# Patient Record
Sex: Female | Born: 1995 | Race: Black or African American | Hispanic: No | Smoking: Never smoker
Health system: Southern US, Community
[De-identification: ages and names within clinical notes are randomized; demographics above are authoritative.]

## PROBLEM LIST (undated history)

## (undated) DIAGNOSIS — O1420 HELLP syndrome (HELLP), unspecified trimester: Secondary | ICD-10-CM

## (undated) DIAGNOSIS — J45909 Unspecified asthma, uncomplicated: Secondary | ICD-10-CM

## (undated) DIAGNOSIS — D649 Anemia, unspecified: Secondary | ICD-10-CM

## (undated) HISTORY — PX: PILONIDAL CYST EXCISION: SHX744

---

## 2017-10-21 DIAGNOSIS — O09812 Supervision of pregnancy resulting from assisted reproductive technology, second trimester: Secondary | ICD-10-CM | POA: Diagnosis not present

## 2017-10-21 DIAGNOSIS — O30042 Twin pregnancy, dichorionic/diamniotic, second trimester: Secondary | ICD-10-CM | POA: Diagnosis not present

## 2017-10-21 DIAGNOSIS — Z3A15 15 weeks gestation of pregnancy: Secondary | ICD-10-CM | POA: Diagnosis not present

## 2017-10-23 DIAGNOSIS — R946 Abnormal results of thyroid function studies: Secondary | ICD-10-CM | POA: Diagnosis not present

## 2017-10-23 DIAGNOSIS — E049 Nontoxic goiter, unspecified: Secondary | ICD-10-CM | POA: Diagnosis not present

## 2017-10-23 DIAGNOSIS — E041 Nontoxic single thyroid nodule: Secondary | ICD-10-CM | POA: Diagnosis not present

## 2017-10-28 DIAGNOSIS — O26892 Other specified pregnancy related conditions, second trimester: Secondary | ICD-10-CM | POA: Diagnosis not present

## 2017-10-28 DIAGNOSIS — R51 Headache: Secondary | ICD-10-CM | POA: Diagnosis not present

## 2017-10-28 DIAGNOSIS — R110 Nausea: Secondary | ICD-10-CM | POA: Diagnosis not present

## 2017-11-19 DIAGNOSIS — Z3A19 19 weeks gestation of pregnancy: Secondary | ICD-10-CM | POA: Diagnosis not present

## 2017-11-19 DIAGNOSIS — O30042 Twin pregnancy, dichorionic/diamniotic, second trimester: Secondary | ICD-10-CM | POA: Diagnosis not present

## 2017-12-22 DIAGNOSIS — O30042 Twin pregnancy, dichorionic/diamniotic, second trimester: Secondary | ICD-10-CM | POA: Diagnosis not present

## 2017-12-22 DIAGNOSIS — O09812 Supervision of pregnancy resulting from assisted reproductive technology, second trimester: Secondary | ICD-10-CM | POA: Diagnosis not present

## 2017-12-22 DIAGNOSIS — Z3A24 24 weeks gestation of pregnancy: Secondary | ICD-10-CM | POA: Diagnosis not present

## 2018-01-20 DIAGNOSIS — O26873 Cervical shortening, third trimester: Secondary | ICD-10-CM | POA: Diagnosis not present

## 2018-01-20 DIAGNOSIS — O09812 Supervision of pregnancy resulting from assisted reproductive technology, second trimester: Secondary | ICD-10-CM | POA: Diagnosis not present

## 2018-01-20 DIAGNOSIS — O30042 Twin pregnancy, dichorionic/diamniotic, second trimester: Secondary | ICD-10-CM | POA: Diagnosis not present

## 2018-01-20 DIAGNOSIS — Z3A28 28 weeks gestation of pregnancy: Secondary | ICD-10-CM | POA: Diagnosis not present

## 2018-01-20 DIAGNOSIS — O30043 Twin pregnancy, dichorionic/diamniotic, third trimester: Secondary | ICD-10-CM | POA: Diagnosis not present

## 2018-02-03 DIAGNOSIS — O30043 Twin pregnancy, dichorionic/diamniotic, third trimester: Secondary | ICD-10-CM | POA: Diagnosis not present

## 2018-02-03 DIAGNOSIS — O26873 Cervical shortening, third trimester: Secondary | ICD-10-CM | POA: Diagnosis not present

## 2018-02-03 DIAGNOSIS — Z3A30 30 weeks gestation of pregnancy: Secondary | ICD-10-CM | POA: Diagnosis not present

## 2018-02-07 DIAGNOSIS — O30043 Twin pregnancy, dichorionic/diamniotic, third trimester: Secondary | ICD-10-CM | POA: Diagnosis not present

## 2018-02-07 DIAGNOSIS — Z3A30 30 weeks gestation of pregnancy: Secondary | ICD-10-CM | POA: Diagnosis not present

## 2018-02-07 DIAGNOSIS — O99713 Diseases of the skin and subcutaneous tissue complicating pregnancy, third trimester: Secondary | ICD-10-CM | POA: Diagnosis not present

## 2018-02-07 DIAGNOSIS — L309 Dermatitis, unspecified: Secondary | ICD-10-CM | POA: Diagnosis not present

## 2018-02-07 DIAGNOSIS — O4703 False labor before 37 completed weeks of gestation, third trimester: Secondary | ICD-10-CM | POA: Diagnosis not present

## 2018-02-07 DIAGNOSIS — O09813 Supervision of pregnancy resulting from assisted reproductive technology, third trimester: Secondary | ICD-10-CM | POA: Diagnosis not present

## 2018-02-08 DIAGNOSIS — Z3A30 30 weeks gestation of pregnancy: Secondary | ICD-10-CM | POA: Diagnosis not present

## 2018-02-08 DIAGNOSIS — O30043 Twin pregnancy, dichorionic/diamniotic, third trimester: Secondary | ICD-10-CM | POA: Diagnosis not present

## 2018-02-08 DIAGNOSIS — O09813 Supervision of pregnancy resulting from assisted reproductive technology, third trimester: Secondary | ICD-10-CM | POA: Diagnosis not present

## 2018-02-08 DIAGNOSIS — L309 Dermatitis, unspecified: Secondary | ICD-10-CM | POA: Diagnosis not present

## 2018-02-08 DIAGNOSIS — O99213 Obesity complicating pregnancy, third trimester: Secondary | ICD-10-CM | POA: Diagnosis not present

## 2018-02-08 DIAGNOSIS — O99713 Diseases of the skin and subcutaneous tissue complicating pregnancy, third trimester: Secondary | ICD-10-CM | POA: Diagnosis not present

## 2018-02-08 DIAGNOSIS — E669 Obesity, unspecified: Secondary | ICD-10-CM | POA: Diagnosis not present

## 2018-02-09 DIAGNOSIS — Z3A31 31 weeks gestation of pregnancy: Secondary | ICD-10-CM | POA: Diagnosis not present

## 2018-02-09 DIAGNOSIS — O30043 Twin pregnancy, dichorionic/diamniotic, third trimester: Secondary | ICD-10-CM | POA: Diagnosis not present

## 2018-02-11 DIAGNOSIS — O30043 Twin pregnancy, dichorionic/diamniotic, third trimester: Secondary | ICD-10-CM | POA: Diagnosis not present

## 2018-02-11 DIAGNOSIS — Z3A31 31 weeks gestation of pregnancy: Secondary | ICD-10-CM | POA: Diagnosis not present

## 2018-02-11 DIAGNOSIS — O09813 Supervision of pregnancy resulting from assisted reproductive technology, third trimester: Secondary | ICD-10-CM | POA: Diagnosis not present

## 2018-02-12 DIAGNOSIS — O30013 Twin pregnancy, monochorionic/monoamniotic, third trimester: Secondary | ICD-10-CM | POA: Diagnosis not present

## 2018-02-12 DIAGNOSIS — O4703 False labor before 37 completed weeks of gestation, third trimester: Secondary | ICD-10-CM | POA: Diagnosis not present

## 2018-02-12 DIAGNOSIS — Z3A31 31 weeks gestation of pregnancy: Secondary | ICD-10-CM | POA: Diagnosis not present

## 2018-02-13 DIAGNOSIS — O26893 Other specified pregnancy related conditions, third trimester: Secondary | ICD-10-CM | POA: Diagnosis not present

## 2018-02-13 DIAGNOSIS — R1084 Generalized abdominal pain: Secondary | ICD-10-CM | POA: Diagnosis not present

## 2018-02-13 DIAGNOSIS — O30013 Twin pregnancy, monochorionic/monoamniotic, third trimester: Secondary | ICD-10-CM | POA: Diagnosis not present

## 2018-02-13 DIAGNOSIS — Z3A31 31 weeks gestation of pregnancy: Secondary | ICD-10-CM | POA: Diagnosis not present

## 2018-02-28 DIAGNOSIS — O1423 HELLP syndrome (HELLP), third trimester: Secondary | ICD-10-CM

## 2018-06-15 ENCOUNTER — Ambulatory Visit
Admission: EM | Admit: 2018-06-15 | Discharge: 2018-06-15 | Disposition: A | Discharge status: Home / Self Care | Payer: BLUE CROSS/BLUE SHIELD | Attending: Family Medicine | Admitting: Family Medicine

## 2018-06-15 ENCOUNTER — Encounter: Payer: Self-pay | Admitting: Emergency Medicine

## 2018-06-15 ENCOUNTER — Other Ambulatory Visit: Payer: Self-pay

## 2018-06-15 DIAGNOSIS — A084 Viral intestinal infection, unspecified: Secondary | ICD-10-CM

## 2018-06-15 MED ORDER — ONDANSETRON 8 MG PO TBDP: 8.0000 mg | ORAL_TABLET | Freq: Three times a day (TID) | ORAL | 0 refills | Status: DC | PRN

## 2018-06-15 NOTE — ED Triage Notes (Signed)
Patient c/o nausea, body aches and headache that started last night. Denies fever.

## 2018-06-15 NOTE — ED Provider Notes (Signed)
[facility]    CSN: [id number] Arrival date & time: 06/15/18  1523     History   Chief Complaint Chief Complaint  Patient presents with  . Nausea  . Headache    HPI Jhenesis Keefauver is a 23 y.o. female.   23 yo female with a c/o nausea, bodyaches, stomache and headache since last night. Denies any fevers, vomiting or diarrhea.   The history is provided by the patient.  Headache    History reviewed. No pertinent past medical history.  There are no active problems to display for this patient.   Past Surgical History:  Procedure Laterality Date  . CESAREAN SECTION      OB History   No obstetric history on file.      Home Medications    Prior to Admission medications   Medication Sig Start Date End Date Taking? Authorizing Provider  ondansetron (ZOFRAN ODT) 8 MG disintegrating tablet Take 1 tablet (8 mg total) by mouth every 8 (eight) hours as needed. 06/15/18   Payton Mccallum, MD    Family History History reviewed. No pertinent family history.  Social History Social History   Tobacco Use  . Smoking status: Never Smoker  . Smokeless tobacco: Never Used  Substance Use Topics  . Alcohol use: Not Currently  . Drug use: Never     Allergies   Patient has no known allergies.   Review of Systems Review of Systems  Neurological: Positive for headaches.     Physical Exam Triage Vital Signs ED Triage Vitals  Enc Vitals Group     BP 06/15/18 1555 (!) 128/93     Pulse Rate 06/15/18 1555 73     Resp 06/15/18 1555 18     Temp 06/15/18 1555 98.5 F (36.9 C)     Temp Source 06/15/18 1555 Oral     SpO2 06/15/18 1555 100 %     Weight 06/15/18 1553 150 lb (68 kg)     Height 06/15/18 1553 5\' 1"  (1.549 m)     Head Circumference --      Peak Flow --      Pain Score 06/15/18 1553 6     Pain Loc --      Pain Edu? --      Excl. in [place]? --    No data found.  Updated Vital Signs BP (!) 128/93 (BP Location: Right Arm)   Pulse 73   Temp 98.5 F  (36.9 C) (Oral)   Resp 18   Ht 5\' 1"  (1.549 m)   Wt 68 kg   LMP 06/08/2018   SpO2 100%   BMI 28.34 kg/m   Visual Acuity Right Eye Distance:   Left Eye Distance:   Bilateral Distance:    Right Eye Near:   Left Eye Near:    Bilateral Near:     Physical Exam Constitutional:      General: She is not in acute distress.    Appearance: She is not toxic-appearing or diaphoretic.  Cardiovascular:     Rate and Rhythm: Normal rate.  Pulmonary:     Effort: Pulmonary effort is normal. No respiratory distress.  Abdominal:     General: Bowel sounds are normal. There is no distension.     Palpations: Abdomen is soft. There is no mass.     Tenderness: There is no abdominal tenderness. There is no right CVA tenderness, left CVA tenderness, guarding or rebound.     Hernia: No hernia is present.  Neurological:  Mental Status: She is alert.      UC Treatments / Results  Labs (all labs ordered are listed, but only abnormal results are displayed) Labs Reviewed - No data to display  EKG None  Radiology No results found.  Procedures Procedures (including critical care time)  Medications Ordered in UC Medications - No data to display  Initial Impression / Assessment and Plan / UC Course  I have reviewed the triage vital signs and the nursing notes.  Pertinent labs & imaging results that were available during my care of the patient were reviewed by me and considered in my medical decision making (see chart for details).      Final Clinical Impressions(s) / UC Diagnoses   Final diagnoses:  Viral gastroenteritis    ED Prescriptions    Medication Sig Dispense Auth. Provider   ondansetron (ZOFRAN ODT) 8 MG disintegrating tablet Take 1 tablet (8 mg total) by mouth every 8 (eight) hours as needed. 6 tablet Payton Mccallum, MD     1. diagnosis reviewed with patient 2. rx as per orders above; reviewed possible side effects, interactions, risks and benefits  3. Recommend  supportive treatment with rest, clear liquids then advance diet slowly as tolerated 4. Follow-up prn if symptoms worsen or don't improve   Controlled Substance Prescriptions [place] Controlled Substance Registry consulted? Not Applicable   Payton Mccallum, MD 06/15/18 [phone]

## 2019-06-03 DIAGNOSIS — Z3401 Encounter for supervision of normal first pregnancy, first trimester: Secondary | ICD-10-CM | POA: Diagnosis not present

## 2019-06-03 DIAGNOSIS — Z3480 Encounter for supervision of other normal pregnancy, unspecified trimester: Secondary | ICD-10-CM | POA: Diagnosis not present

## 2019-06-03 DIAGNOSIS — Z8759 Personal history of other complications of pregnancy, childbirth and the puerperium: Secondary | ICD-10-CM | POA: Diagnosis not present

## 2019-06-03 DIAGNOSIS — Z3481 Encounter for supervision of other normal pregnancy, first trimester: Secondary | ICD-10-CM | POA: Diagnosis not present

## 2019-06-19 ENCOUNTER — Other Ambulatory Visit: Payer: Self-pay

## 2019-06-19 ENCOUNTER — Encounter: Payer: Self-pay | Admitting: Emergency Medicine

## 2019-06-19 DIAGNOSIS — Z3A01 Less than 8 weeks gestation of pregnancy: Secondary | ICD-10-CM | POA: Diagnosis not present

## 2019-06-19 DIAGNOSIS — O468X1 Other antepartum hemorrhage, first trimester: Secondary | ICD-10-CM | POA: Diagnosis not present

## 2019-06-19 DIAGNOSIS — Z3A11 11 weeks gestation of pregnancy: Secondary | ICD-10-CM | POA: Diagnosis not present

## 2019-06-19 DIAGNOSIS — O368910 Maternal care for other specified fetal problems, first trimester, not applicable or unspecified: Secondary | ICD-10-CM | POA: Diagnosis not present

## 2019-06-19 DIAGNOSIS — O418X11 Other specified disorders of amniotic fluid and membranes, first trimester, fetus 1: Secondary | ICD-10-CM | POA: Diagnosis not present

## 2019-06-19 DIAGNOSIS — O209 Hemorrhage in early pregnancy, unspecified: Secondary | ICD-10-CM | POA: Diagnosis present

## 2019-06-19 DIAGNOSIS — O208 Other hemorrhage in early pregnancy: Secondary | ICD-10-CM | POA: Diagnosis not present

## 2019-06-19 LAB — CBC WITH DIFFERENTIAL/PLATELET
Abs Immature Granulocytes: 0.02 10*3/uL (ref 0.00–0.07)
Basophils Absolute: 0 10*3/uL (ref 0.0–0.1)
Basophils Relative: 1 %
Eosinophils Absolute: 0.1 10*3/uL (ref 0.0–0.5)
Eosinophils Relative: 1 %
HCT: 36.3 % (ref 36.0–46.0)
Hemoglobin: 11.5 g/dL — ABNORMAL LOW (ref 12.0–15.0)
Immature Granulocytes: 0 %
Lymphocytes Relative: 27 %
Lymphs Abs: 2.3 10*3/uL (ref 0.7–4.0)
MCH: 26.3 pg (ref 26.0–34.0)
MCHC: 31.7 g/dL (ref 30.0–36.0)
MCV: 82.9 fL (ref 80.0–100.0)
Monocytes Absolute: 0.5 10*3/uL (ref 0.1–1.0)
Monocytes Relative: 6 %
Neutro Abs: 5.7 10*3/uL (ref 1.7–7.7)
Neutrophils Relative %: 65 %
Platelets: 413 10*3/uL — ABNORMAL HIGH (ref 150–400)
RBC: 4.38 MIL/uL (ref 3.87–5.11)
RDW: 16.1 % — ABNORMAL HIGH (ref 11.5–15.5)
WBC: 8.7 10*3/uL (ref 4.0–10.5)
nRBC: 0 % (ref 0.0–0.2)

## 2019-06-19 LAB — URINALYSIS, COMPLETE (UACMP) WITH MICROSCOPIC
Bilirubin Urine: NEGATIVE
Glucose, UA: NEGATIVE mg/dL
Ketones, ur: 20 mg/dL — AB
Nitrite: NEGATIVE
Protein, ur: 100 mg/dL — AB
Specific Gravity, Urine: 1.026 (ref 1.005–1.030)
pH: 6 (ref 5.0–8.0)

## 2019-06-19 NOTE — ED Triage Notes (Signed)
Patient states that she is [redacted] weeks pregnant and about an hour ago she started having some vaginal bleeding. Patient denies abdominal pain.

## 2019-06-20 ENCOUNTER — Emergency Department
Admission: EM | Admit: 2019-06-20 | Discharge: 2019-06-20 | Disposition: A | Discharge status: Home / Self Care | Payer: BC Managed Care – PPO | Attending: Emergency Medicine | Admitting: Emergency Medicine

## 2019-06-20 ENCOUNTER — Emergency Department: Payer: BC Managed Care – PPO

## 2019-06-20 DIAGNOSIS — Z3A11 11 weeks gestation of pregnancy: Secondary | ICD-10-CM | POA: Diagnosis not present

## 2019-06-20 DIAGNOSIS — O4690 Antepartum hemorrhage, unspecified, unspecified trimester: Secondary | ICD-10-CM

## 2019-06-20 DIAGNOSIS — O418X11 Other specified disorders of amniotic fluid and membranes, first trimester, fetus 1: Secondary | ICD-10-CM | POA: Diagnosis not present

## 2019-06-20 DIAGNOSIS — O208 Other hemorrhage in early pregnancy: Secondary | ICD-10-CM | POA: Diagnosis not present

## 2019-06-20 DIAGNOSIS — O368910 Maternal care for other specified fetal problems, first trimester, not applicable or unspecified: Secondary | ICD-10-CM | POA: Diagnosis not present

## 2019-06-20 DIAGNOSIS — O418X10 Other specified disorders of amniotic fluid and membranes, first trimester, not applicable or unspecified: Secondary | ICD-10-CM

## 2019-06-20 DIAGNOSIS — Z3A01 Less than 8 weeks gestation of pregnancy: Secondary | ICD-10-CM | POA: Diagnosis not present

## 2019-06-20 LAB — ABO/RH: ABO/RH(D): A POS

## 2019-06-20 LAB — HCG, QUANTITATIVE, PREGNANCY: hCG, Beta Chain, Quant, S: 78224 m[IU]/mL — ABNORMAL HIGH (ref <5)

## 2019-06-20 MED ORDER — DEXTROSE-NACL 5-0.9 % IV SOLN: INTRAVENOUS | Status: DC

## 2019-06-20 MED ORDER — SODIUM CHLORIDE 0.9 % IV BOLUS
1000.0000 mL | Freq: Once | INTRAVENOUS | Status: AC
  Administered 2019-06-20: 1000 mL via INTRAVENOUS

## 2019-06-20 NOTE — ED Notes (Signed)
Pt assisted to the restroom at this time. Pt st she feels better, pt informed of pending d/c

## 2019-06-20 NOTE — ED Provider Notes (Signed)
[facility] Emergency Department Provider Note  ____________________________________________   First MD Initiated Contact with Patient 06/20/19 0038     (approximate)  I have reviewed the triage vital signs and the nursing notes.   HISTORY  Chief Complaint Vaginal Bleeding    HPI Sherry Lynn is a 24 y.o. female G2 P1-0-0-2 (previous twin gestation) currently [redacted] weeks pregnant presents to the emergency department secondary to acute onset of painless vaginal bleeding which began 1 hour before arrival.  Patient states that she noted a "gush of blood" at onset.  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 No pertinent past medical history.  There are no problems to display for this patient.   Past Surgical History:  Procedure Laterality Date  . CESAREAN SECTION    . PILONIDAL CYST EXCISION      Prior to Admission medications   Medication Sig Start Date End Date Taking? Authorizing Provider  ondansetron (ZOFRAN ODT) 8 MG disintegrating tablet Take 1 tablet (8 mg total) by mouth every 8 (eight) hours as needed. 06/15/18   Norval Gable, MD    Allergies Other  No family history on file.  Social History Social History   Tobacco Use  . Smoking status: Never Smoker  . Smokeless tobacco: Never Used  Substance Use Topics  . Alcohol use: Not Currently  . Drug use: Never    Review of Systems Constitutional: No fever/chills Eyes: No visual changes. ENT: No sore throat. Cardiovascular: Denies chest pain. Respiratory: Denies shortness of breath. Gastrointestinal: No abdominal pain.  No nausea, no vomiting.  No diarrhea.  No constipation. Genitourinary: Positive for vaginal bleeding Musculoskeletal: Negative for neck pain.  Negative for back pain. Integumentary: Negative for rash. Neurological: Negative for  headaches, focal weakness or numbness.  ____________________________________________   PHYSICAL EXAM:  VITAL SIGNS: ED Triage Vitals  Enc Vitals Group     BP 06/19/19 2300 134/80     Pulse Rate 06/19/19 2300 84     Resp 06/19/19 2300 18     Temp 06/19/19 2300 98.4 F (36.9 C)     Temp Source 06/19/19 2300 Oral     SpO2 06/19/19 2300 100 %     Weight 06/19/19 2301 83 kg (183 lb)     Height 06/19/19 2301 1.549 m (5\' 1" )     Head Circumference --      Peak Flow --      Pain Score 06/19/19 2301 0     Pain Loc --      Pain Edu? --      Excl. in [place]? --     Constitutional: Alert and oriented.  Eyes: Conjunctivae are normal.  Mouth/Throat: Patient is wearing a mask. Neck: No stridor.  No meningeal signs.   Cardiovascular: Normal rate, regular rhythm. Good peripheral circulation. Grossly normal heart sounds. Respiratory: Normal respiratory effort.  No retractions. Gastrointestinal: Soft and nontender. No distention.  Genitourinary: Scant blood noted in the vagina cervical os closed Musculoskeletal: No lower extremity tenderness nor edema. No gross deformities of extremities. Neurologic:  Normal speech and language. No gross focal neurologic deficits are appreciated.  Skin:  Skin is warm, dry and intact. Psychiatric: Mood and affect are normal. Speech and behavior are normal.  ____________________________________________   LABS (all labs ordered are listed, but only abnormal results are displayed)  Labs Reviewed  CBC WITH  DIFFERENTIAL/PLATELET - Abnormal; Notable for the following components:      Result Value   Hemoglobin 11.5 (*)    RDW 16.1 (*)    Platelets 413 (*)    All other components within normal limits  HCG, QUANTITATIVE, PREGNANCY - Abnormal; Notable for the following components:   hCG, Beta Chain, Quant, S 78,224 (*)    All other components within normal limits  URINALYSIS, COMPLETE (UACMP) WITH MICROSCOPIC - Abnormal; Notable for the following components:    Color, Urine PINK (*)    APPearance CLOUDY (*)    Hgb urine dipstick LARGE (*)    Ketones, ur 20 (*)    Protein, ur 100 (*)    Leukocytes,Ua MODERATE (*)    All other components within normal limits  BASIC METABOLIC PANEL  POC URINE PREG, ED  ABO/RH     RADIOLOGY I, Langford N Dailon Sheeran, personally viewed and evaluated these images (plain radiographs) as part of my medical decision making, as well as reviewing the written report by the radiologist.  ED MD interpretation: 11 weeks 4 days intrauterine pregnancy with fetal heart rate 153.  Small subchorionic hemorrhage noted per radiologist on OB ultrasound less than 14 weeks  Official radiology report(s): US OB Comp Less 14 Wks  Result Date: 06/20/2019 CLINICAL DATA:  Vaginal bleeding EXAM: OBSTETRIC <14 WK ULTRASOUND TECHNIQUE: Transabdominal ultrasound was performed for evaluation of the gestation as well as the maternal uterus and adnexal regions. COMPARISON:  None. FINDINGS: Intrauterine gestational sac: Single Yolk sac:  Not visualized Embryo:  Visualized Cardiac Activity: Visualized Heart Rate: 153 bpm MSD:    mm    w     d CRL:   47.4 mm   11 w 4 d                  [place] EDC: 01/05/2020 Subchorionic hemorrhage:  Small subchorionic hemorrhage Maternal uterus/adnexae: Pedunculated 2.1 cm fundal fibroid. No adnexal mass or free fluid. IMPRESSION: Eleven week 4 day intrauterine pregnancy. Fetal heart rate 153 beats per minute. Small subchorionic hemorrhage. Electronically Signed   By: Charlett Nose M.D.   On: 06/20/2019 01:25      Procedures   ____________________________________________   INITIAL IMPRESSION / MDM / ASSESSMENT AND PLAN / ED COURSE  As part of my medical decision making, I reviewed the following data within the electronic MEDICAL RECORD NUMBER   24 year old female presented with above-stated history and physical exam secondary to vaginal bleeding within the first trimester pregnancy.  Vaginal exam revealed scant blood noted in  vagina with a closed cervical os.  Ultrasound revealed a small subchorionic hematoma.  Patient's blood type a positive.  Spoke with the patient at length regarding the necessity of following up with OB/GYN and/or returning to the emergency department immediately if pain or worsening bleeding were to occur. ____________________________________________  FINAL CLINICAL IMPRESSION(S) / ED DIAGNOSES  Final diagnoses:  Vaginal bleeding in pregnancy  Subchorionic hematoma in first trimester, single or unspecified fetus     MEDICATIONS GIVEN DURING THIS VISIT:  Medications  dextrose 5 %-0.9 % sodium chloride infusion ( Intravenous New Bag/Given 06/20/19 0157)  sodium chloride 0.9 % bolus 1,000 mL (1,000 mLs Intravenous New Bag/Given 06/20/19 0152)     ED Discharge Orders    None      *Please note:  Nakari Bracknell was evaluated in Emergency Department on 06/20/2019 for the symptoms described in the history of present illness. She was evaluated in the context of the global  COVID-19 pandemic, which necessitated consideration that the patient might be at risk for infection with the SARS-CoV-2 virus that causes COVID-19. Institutional protocols and algorithms that pertain to the evaluation of patients at risk for COVID-19 are in a state of rapid change based on information released by regulatory bodies including the CDC and federal and state organizations. These policies and algorithms were followed during the patient's care in the ED.  Some ED evaluations and interventions may be delayed as a result of limited staffing during the pandemic.*  Note:  This document was prepared using Dragon voice recognition software and may include unintentional dictation errors.   Darci Current, MD 06/20/19 0400

## 2019-06-22 ENCOUNTER — Other Ambulatory Visit: Payer: Self-pay

## 2019-06-22 ENCOUNTER — Ambulatory Visit (INDEPENDENT_AMBULATORY_CARE_PROVIDER_SITE_OTHER): Payer: BC Managed Care – PPO | Admitting: Obstetrics and Gynecology

## 2019-06-22 ENCOUNTER — Encounter: Payer: Self-pay | Admitting: Obstetrics and Gynecology

## 2019-06-22 VITALS — BP 128/78 | HR 80 | Ht 61.0 in | Wt 183.7 lb

## 2019-06-22 DIAGNOSIS — O09291 Supervision of pregnancy with other poor reproductive or obstetric history, first trimester: Secondary | ICD-10-CM | POA: Diagnosis not present

## 2019-06-22 DIAGNOSIS — O468X1 Other antepartum hemorrhage, first trimester: Secondary | ICD-10-CM | POA: Diagnosis not present

## 2019-06-22 DIAGNOSIS — O418X10 Other specified disorders of amniotic fluid and membranes, first trimester, not applicable or unspecified: Secondary | ICD-10-CM

## 2019-06-22 DIAGNOSIS — O09811 Supervision of pregnancy resulting from assisted reproductive technology, first trimester: Secondary | ICD-10-CM | POA: Diagnosis not present

## 2019-06-22 DIAGNOSIS — O209 Hemorrhage in early pregnancy, unspecified: Secondary | ICD-10-CM | POA: Diagnosis not present

## 2019-06-22 DIAGNOSIS — IMO0001 Reserved for inherently not codable concepts without codable children: Secondary | ICD-10-CM

## 2019-06-22 NOTE — Progress Notes (Signed)
HPI:      [name] is a 24 y.o. G1P0 who LMP was Patient's last menstrual period was 03/31/2019 (approximate).  Subjective:   She presents today as an ED follow-up for vaginal bleeding in the first trimester.  Patient says that her bleeding resolved for the last 2 days but she noticed some bleeding again today.  She was previously diagnosed with a viable 11-week intrauterine pregnancy with fetal heart tones and a small subchorionic hemorrhage. Her pregnancy is complicated by advanced reproductive technology-conceived by IVF. Her previous pregnancy was also conceived by IVF complicated by identical twinning, short cervix in the second trimester, PIH with help syndrome and subsequent early delivery.    Hx: The following portions of the patient's history were reviewed and updated as appropriate:             She  has no past medical history on file. She does not have a problem list on file. She  has a past surgical history that includes Cesarean section and Pilonidal cyst excision. Her family history is not on file. She  reports that she has never smoked. She has never used smokeless tobacco. She reports previous alcohol use. She reports that she does not use drugs. She has a current medication list which includes the following prescription(s): aspirin ec and multivitamin-prenatal. She is allergic to other.       Review of Systems:  Review of Systems  Constitutional: Denied constitutional symptoms, night sweats, recent illness, fatigue, fever, insomnia and weight loss.  Eyes: Denied eye symptoms, eye pain, photophobia, vision change and visual disturbance.  Ears/Nose/Throat/Neck: Denied ear, nose, throat or neck symptoms, hearing loss, nasal discharge, sinus congestion and sore throat.  Cardiovascular: Denied cardiovascular symptoms, arrhythmia, chest pain/pressure, edema, exercise intolerance, orthopnea and palpitations.  Respiratory: Denied pulmonary symptoms, asthma, pleuritic pain,  productive sputum, cough, dyspnea and wheezing.  Gastrointestinal: Denied, gastro-esophageal reflux, melena, nausea and vomiting.  Genitourinary: Denied genitourinary symptoms including symptomatic vaginal discharge, pelvic relaxation issues, and urinary complaints.  Musculoskeletal: Denied musculoskeletal symptoms, stiffness, swelling, muscle weakness and myalgia.  Dermatologic: Denied dermatology symptoms, rash and scar.  Neurologic: Denied neurology symptoms, dizziness, headache, neck pain and syncope.  Psychiatric: Denied psychiatric symptoms, anxiety and depression.  Endocrine: Denied endocrine symptoms including hot flashes and night sweats.   Meds:   Current Outpatient Medications on File Prior to Visit  Medication Sig Dispense Refill  . aspirin EC 81 MG tablet Take 81 mg by mouth daily.    . Prenatal Vit-Fe Fumarate-FA (MULTIVITAMIN-PRENATAL) 27-0.8 MG TABS tablet Take 1 tablet by mouth daily at 12 noon.     No current facility-administered medications on file prior to visit.    Objective:     Vitals:   06/22/19 0956  BP: 128/78  Pulse: 80              Ultrasound results and ED visit reviewed with the patient.  Assessment:    G1P0 There are no problems to display for this patient.    1. First trimester bleeding   2. Subchorionic hemorrhage of placenta in first trimester, single or unspecified fetus   3. Pregnancy resulting from assisted reproductive technology in first trimester   4. History of HELLP syndrome, currently pregnant, first trimester     Likely vaginal bleeding from small subchorionic hemorrhage.  Obvious viable pregnancy   Plan:            1.  I have reassured her regarding subchorionic hemorrhage.  We have  discussed the possible but rare complications.  I expect her bleeding to continue on and off with small amounts of dark blood possibly up until 16 weeks.  If bleeding continues regularly consider follow-up ultrasound before 20 weeks.  2.   Schedule nurse visit and begin prenatal care next week  3.  New OB physical with fetal heart tones at 13 weeks.  4.  Patient is currently taking 81 mg aspirin daily as directed by IVF docs.  5.  Patient to contact us if she has heavier bleeding and significant cramping. Orders No orders of the defined types were placed in this encounter.   No orders of the defined types were placed in this encounter.     F/U  Return in about 2 weeks (around 07/06/2019). I spent 31 minutes involved in the care of this patient preparing to see the patient by obtaining and reviewing her medical history (including labs, imaging tests and prior procedures), documenting clinical information in the electronic health record (EHR), counseling and coordinating care plans, writing and sending prescriptions, ordering tests or procedures and directly communicating with the patient by discussing pertinent items from her history and physical exam as well as detailing my assessment and plan as noted above so that she has an informed understanding.  All of her questions were answered.  Elonda Husky, M.D. 06/22/2019 12:08 PM

## 2019-06-28 ENCOUNTER — Ambulatory Visit (INDEPENDENT_AMBULATORY_CARE_PROVIDER_SITE_OTHER): Payer: BC Managed Care – PPO | Admitting: Obstetrics and Gynecology

## 2019-06-28 ENCOUNTER — Other Ambulatory Visit: Payer: Self-pay

## 2019-06-28 VITALS — BP 115/72 | HR 74 | Ht 61.0 in | Wt 184.3 lb

## 2019-06-28 DIAGNOSIS — Z113 Encounter for screening for infections with a predominantly sexual mode of transmission: Secondary | ICD-10-CM | POA: Diagnosis not present

## 2019-06-28 DIAGNOSIS — Z0283 Encounter for blood-alcohol and blood-drug test: Secondary | ICD-10-CM

## 2019-06-28 DIAGNOSIS — Z3481 Encounter for supervision of other normal pregnancy, first trimester: Secondary | ICD-10-CM | POA: Diagnosis not present

## 2019-06-28 DIAGNOSIS — R638 Other symptoms and signs concerning food and fluid intake: Secondary | ICD-10-CM | POA: Diagnosis not present

## 2019-06-28 NOTE — Progress Notes (Signed)
      Sherry Lynn presents for NOB nurse intake visit. Pregnancy confirmation done at [facility] , 06/19/19, with Bayard Males, MD.  Luna Glasgow.  [id number].  LMP 03/31/19.  EDD 01/05/2020.  [place] [redacted]w[redacted]d. Pregnancy education material explained and given.  0 cats in the home.  NOB labs ordered. BMI greater than 30. TSH/HbgA1c. Sickle cell order due to race. HIV and drug screen explained and ordered. Genetic screening discussed. Genetic testing; Unsure. Pt to discuss genetic testing with provider. PNV encouraged. Pt to follow up with provider in 1 weeks for NOB physical.  Pt conceived with IVF.   BP 115/72   Pulse 74   Ht 5\' 1"  (1.549 m)   Wt 184 lb 4.8 oz (83.6 kg)   LMP 03/31/2019 (Approximate)   BMI 34.82 kg/m

## 2019-06-28 NOTE — Patient Instructions (Signed)
WHAT OB PATIENTS CAN EXPECT   Confirmation of pregnancy and ultrasound ordered if medically indicated-[redacted] weeks gestation  New OB (NOB) intake with nurse and New OB (NOB) labs- [redacted] weeks gestation  New OB (NOB) physical examination with provider- 11/[redacted] weeks gestation  Flu vaccine-[redacted] weeks gestation  Anatomy scan-[redacted] weeks gestation  Glucose tolerance test, blood work to test for anemia, T-dap vaccine-[redacted] weeks gestation  Vaginal swabs/cultures-STD/Group B strep-[redacted] weeks gestation  Appointments every 4 weeks until 28 weeks  Every 2 weeks from 28 weeks until 36 weeks  Weekly visits from 36 weeks until delivery  How a Baby Grows During Pregnancy  Pregnancy begins when a female's sperm enters a female's egg (fertilization). Fertilization usually happens in one of the tubes (fallopian tubes) that connect the ovaries to the womb (uterus). The fertilized egg moves down the fallopian tube to the uterus. Once it reaches the uterus, it implants into the lining of the uterus and begins to grow. For the first 10 weeks, the fertilized egg is called an embryo. After 10 weeks, it is called a fetus. As the fetus continues to grow, it receives oxygen and nutrients through tissue (placenta) that grows to support the developing baby. The placenta is the life support system for the baby. It provides oxygen and nutrition and removes waste. Learning as much as you can about your pregnancy and how your baby is developing can help you enjoy the experience. It can also make you aware of when there might be a problem and when to ask questions. How long does a typical pregnancy last? A pregnancy usually lasts 280 days, or about 40 weeks. Pregnancy is divided into three periods of growth, also called trimesters:  First trimester: 0-12 weeks.  Second trimester: 13-27 weeks.  Third trimester: 28-40 weeks. The day when your baby is ready to be born (full term) is your estimated date of delivery. How does my baby  develop month by month? First month  The fertilized egg attaches to the inside of the uterus.  Some cells will form the placenta. Others will form the fetus.  The arms, legs, brain, spinal cord, lungs, and heart begin to develop.  At the end of the first month, the heart begins to beat. Second month  The bones, inner ear, eyelids, hands, and feet form.  The genitals develop.  By the end of 8 weeks, all major organs are developing. Third month  All of the internal organs are forming.  Teeth develop below the gums.  Bones and muscles begin to grow. The spine can flex.  The skin is transparent.  Fingernails and toenails begin to form.  Arms and legs continue to grow longer, and hands and feet develop.  The fetus is about 3 inches (7.6 cm) long. Fourth month  The placenta is completely formed.  The external sex organs, neck, outer ear, eyebrows, eyelids, and fingernails are formed.  The fetus can hear, swallow, and move its arms and legs.  The kidneys begin to produce urine.  The skin is covered with a white, waxy coating (vernix) and very fine hair (lanugo). Fifth month  The fetus moves around more and can be felt for the first time (quickening).  The fetus starts to sleep and wake up and may begin to suck its finger.  The nails grow to the end of the fingers.  The organ in the digestive system that makes bile (gallbladder) functions and helps to digest nutrients.  If your baby is a girl, eggs   are present in her ovaries. If your baby is a boy, testicles start to move down into his scrotum. Sixth month  The lungs are formed.  The eyes open. The brain continues to develop.  Your baby has fingerprints and toe prints. Your baby's hair grows thicker.  At the end of the second trimester, the fetus is about 9 inches (22.9 cm) long. Seventh month  The fetus kicks and stretches.  The eyes are developed enough to sense changes in light.  The hands can make a  grasping motion.  The fetus responds to sound. Eighth month  All organs and body systems are fully developed and functioning.  Bones harden, and taste buds develop. The fetus may hiccup.  Certain areas of the brain are still developing. The skull remains soft. Ninth month  The fetus gains about  lb (0.23 kg) each week.  The lungs are fully developed.  Patterns of sleep develop.  The fetus's head typically moves into a head-down position (vertex) in the uterus to prepare for birth.  The fetus weighs 6-9 lb (2.72-4.08 kg) and is 19-20 inches (48.26-50.8 cm) long. What can I do to have a healthy pregnancy and help my baby develop? General instructions  Take prenatal vitamins as directed by your health care provider. These include vitamins such as folic acid, iron, calcium, and vitamin D. They are important for healthy development.  Take medicines only as directed by your health care provider. Read labels and ask a pharmacist or your health care provider whether over-the-counter medicines, supplements, and prescription drugs are safe to take during pregnancy.  Keep all follow-up visits as directed by your health care provider. This is important. Follow-up visits include prenatal care and screening tests. How do I know if my baby is developing well? At each prenatal visit, your health care provider will do several different tests to check on your health and keep track of your baby's development. These include:  Fundal height and position. ? Your health care provider will measure your growing belly from your pubic bone to the top of the uterus using a tape measure. ? Your health care provider will also feel your belly to determine your baby's position.  Heartbeat. ? An ultrasound in the first trimester can confirm pregnancy and show a heartbeat, depending on how far along you are. ? Your health care provider will check your baby's heart rate at every prenatal visit.  Second  trimester ultrasound. ? This ultrasound checks your baby's development. It also may show your baby's gender. What should I do if I have concerns about my baby's development? Always talk with your health care provider about any concerns that you may have about your pregnancy and your baby. Summary  A pregnancy usually lasts 280 days, or about 40 weeks. Pregnancy is divided into three periods of growth, also called trimesters.  Your health care provider will monitor your baby's growth and development throughout your pregnancy.  Follow your health care provider's recommendations about taking prenatal vitamins and medicines during your pregnancy.  Talk with your health care provider if you have any concerns about your pregnancy or your developing baby. This information is not intended to replace advice given to you by your health care provider. Make sure you discuss any questions you have with your health care provider. Document Revised: 07/09/2018 Document Reviewed: 01/29/2017 Else[employer] Patient Education  2020 [employer] First Trimester of Pregnancy  The first trimester of pregnancy is from week 1 until the end of week   13 (months 1 through 3). During this time, your baby will begin to develop inside you. At 6-8 weeks, the eyes and face are formed, and the heartbeat can be seen on ultrasound. At the end of 12 weeks, all the baby's organs are formed. Prenatal care is all the medical care you receive before the birth of your baby. Make sure you get good prenatal care and follow all of your doctor's instructions. Follow these instructions at home: Medicines  Take over-the-counter and prescription medicines only as told by your doctor. Some medicines are safe and some medicines are not safe during pregnancy.  Take a prenatal vitamin that contains at least 600 micrograms (mcg) of folic acid.  If you have trouble pooping (constipation), take medicine that will make your stool soft (stool softener)  if your doctor approves. Eating and drinking   Eat regular, healthy meals.  Your doctor will tell you the amount of weight gain that is right for you.  Avoid raw meat and uncooked cheese.  If you feel sick to your stomach (nauseous) or throw up (vomit): ? Eat 4 or 5 small meals a day instead of 3 large meals. ? Try eating a few soda crackers. ? Drink liquids between meals instead of during meals.  To prevent constipation: ? Eat foods that are high in fiber, like fresh fruits and vegetables, whole grains, and beans. ? Drink enough fluids to keep your pee (urine) clear or pale yellow. Activity  Exercise only as told by your doctor. Stop exercising if you have cramps or pain in your lower belly (abdomen) or low back.  Do not exercise if it is too hot, too humid, or if you are in a place of great height (high altitude).  Try to avoid standing for long periods of time. Move your legs often if you must stand in one place for a long time.  Avoid heavy lifting.  Wear low-heeled shoes. Sit and stand up straight.  You can have sex unless your doctor tells you not to. Relieving pain and discomfort  Wear a good support bra if your breasts are sore.  Take warm water baths (sitz baths) to soothe pain or discomfort caused by hemorrhoids. Use hemorrhoid cream if your doctor says it is okay.  Rest with your legs raised if you have leg cramps or low back pain.  If you have puffy, bulging veins (varicose veins) in your legs: ? Wear support hose or compression stockings as told by your doctor. ? Raise (elevate) your feet for 15 minutes, 3-4 times a day. ? Limit salt in your food. Prenatal care  Schedule your prenatal visits by the twelfth week of pregnancy.  Write down your questions. Take them to your prenatal visits.  Keep all your prenatal visits as told by your doctor. This is important. Safety  Wear your seat belt at all times when driving.  Make a list of emergency phone  numbers. The list should include numbers for family, friends, the hospital, and police and fire departments. General instructions  Ask your doctor for a referral to a local prenatal class. Begin classes no later than at the start of month 6 of your pregnancy.  Ask for help if you need counseling or if you need help with nutrition. Your doctor can give you advice or tell you where to go for help.  Do not use hot tubs, steam rooms, or saunas.  Do not douche or use tampons or scented sanitary pads.  Do not cross   your legs for long periods of time.  Avoid all herbs and alcohol. Avoid drugs that are not approved by your doctor.  Do not use any tobacco products, including cigarettes, chewing tobacco, and electronic cigarettes. If you need help quitting, ask your doctor. You may get counseling or other support to help you quit.  Avoid cat litter boxes and soil used by cats. These carry germs that can cause birth defects in the baby and can cause a loss of your baby (miscarriage) or stillbirth.  Visit your dentist. At home, brush your teeth with a soft toothbrush. Be gentle when you floss. Contact a doctor if:  You are dizzy.  You have mild cramps or pressure in your lower belly.  You have a nagging pain in your belly area.  You continue to feel sick to your stomach, you throw up, or you have watery poop (diarrhea).  You have a bad smelling fluid coming from your vagina.  You have pain when you pee (urinate).  You have increased puffiness (swelling) in your face, hands, legs, or ankles. Get help right away if:  You have a fever.  You are leaking fluid from your vagina.  You have spotting or bleeding from your vagina.  You have very bad belly cramping or pain.  You gain or lose weight rapidly.  You throw up blood. It may look like coffee grounds.  You are around people who have German measles, fifth disease, or chickenpox.  You have a very bad headache.  You have shortness  of breath.  You have any kind of trauma, such as from a fall or a car accident. Summary  The first trimester of pregnancy is from week 1 until the end of week 13 (months 1 through 3).  To take care of yourself and your unborn baby, you will need to eat healthy meals, take medicines only if your doctor tells you to do so, and do activities that are safe for you and your baby.  Keep all follow-up visits as told by your doctor. This is important as your doctor will have to ensure that your baby is healthy and growing well. This information is not intended to replace advice given to you by your health care provider. Make sure you discuss any questions you have with your health care provider. Document Revised: 07/09/2018 Document Reviewed: 03/26/2016 Elsevier Patient Education  2020 [employer] Commonly Asked Questions During Pregnancy  Cats: A parasite can be excreted in cat feces.  To avoid exposure you need to have another person empty the little box.  If you must empty the litter box you will need to wear gloves.  Wash your hands after handling your cat.  This parasite can also be found in raw or undercooked meat so this should also be avoided.  Colds, Sore Throats, Flu: Please check your medication sheet to see what you can take for symptoms.  If your symptoms are unrelieved by these medications please call the office.  Dental Work: Most any dental work your dentist recommends is permitted.  X-rays should only be taken during the first trimester if absolutely necessary.  Your abdomen should be shielded with a lead apron during all x-rays.  Please notify your provider prior to receiving any x-rays.  Novocaine is fine; gas is not recommended.  If your dentist requires a note from us prior to dental work please call the office and we will provide one for you.  Exercise: Exercise is an important part of staying   healthy during your pregnancy.  You may continue most exercises you were accustomed to  prior to pregnancy.  Later in your pregnancy you will most likely notice you have difficulty with activities requiring balance like riding a bicycle.  It is important that you listen to your body and avoid activities that put you at a higher risk of falling.  Adequate rest and staying well hydrated are a must!  If you have questions about the safety of specific activities ask your provider.    Exposure to Children with illness: Try to avoid obvious exposure; report any symptoms to us when noted,  If you have chicken pos, red measles or mumps, you should be immune to these diseases.   Please do not take any vaccines while pregnant unless you have checked with your OB provider.  Fetal Movement: After 28 weeks we recommend you do "kick counts" twice daily.  Lie or sit down in a calm quiet environment and count your baby movements "kicks".  You should feel your baby at least 10 times per hour.  If you have not felt 10 kicks within the first hour get up, walk around and have something sweet to eat or drink then repeat for an additional hour.  If count remains less than 10 per hour notify your provider.  Fumigating: Follow your pest control agent's advice as to how long to stay out of your home.  Ventilate the area well before re-entering.  Hemorrhoids:   Most over-the-counter preparations can be used during pregnancy.  Check your medication to see what is safe to use.  It is important to use a stool softener or fiber in your diet and to drink lots of liquids.  If hemorrhoids seem to be getting worse please call the office.   Hot Tubs:  Hot tubs Jacuzzis and saunas are not recommended while pregnant.  These increase your internal body temperature and should be avoided.  Intercourse:  Sexual intercourse is safe during pregnancy as long as you are comfortable, unless otherwise advised by your provider.  Spotting may occur after intercourse; report any bright red bleeding that is heavier than spotting.  Labor:   If you know that you are in labor, please go to the hospital.  If you are unsure, please call the office and let us help you decide what to do.  Lifting, straining, etc:  If your job requires heavy lifting or straining please check with your provider for any limitations.  Generally, you should not lift items heavier than that you can lift simply with your hands and arms (no back muscles)  Painting:  Paint fumes do not harm your pregnancy, but may make you ill and should be avoided if possible.  Latex or water based paints have less odor than oils.  Use adequate ventilation while painting.  Permanents & Hair Color:  Chemicals in hair dyes are not recommended as they cause increase hair dryness which can increase hair loss during pregnancy.  " Highlighting" and permanents are allowed.  Dye may be absorbed differently and permanents may not hold as well during pregnancy.  Sunbathing:  Use a sunscreen, as skin burns easily during pregnancy.  Drink plenty of fluids; avoid over heating.  Tanning Beds:  Because their possible side effects are still unknown, tanning beds are not recommended.  Ultrasound Scans:  Routine ultrasounds are performed at approximately 20 weeks.  You will be able to see your baby's general anatomy an if you would like to know the gender   this can usually be determined as well.  If it is questionable when you conceived you may also receive an ultrasound early in your pregnancy for dating purposes.  Otherwise ultrasound exams are not routinely performed unless there is a medical necessity.  Although you can request a scan we ask that you pay for it when conducted because insurance does not cover " patient request" scans.  Work: If your pregnancy proceeds without complications you may work until your due date, unless your physician or employer advises otherwise.  Round Ligament Pain/Pelvic Discomfort:  Sharp, shooting pains not associated with bleeding are fairly common, usually  occurring in the second trimester of pregnancy.  They tend to be worse when standing up or when you remain standing for long periods of time.  These are the result of pressure of certain pelvic ligaments called "round ligaments".  Rest, Tylenol and heat seem to be the most effective relief.  As the womb and fetus grow, they rise out of the pelvis and the discomfort improves.  Please notify the office if your pain seems different than that described.  It may represent a more serious condition.  Common Medications Safe in Pregnancy  Acne:      Constipation:  Benzoyl Peroxide     Colace  Clindamycin      Dulcolax Suppository  Topica Erythromycin     Fibercon  Salicylic Acid      Metamucil         Miralax AVOID:        Senakot   Accutane    Cough:  Retin-A       Cough Drops  Tetracycline      Phenergan w/ Codeine if Rx  Minocycline      Robitussin (Plain & DM)  Antibiotics:     Crabs/Lice:  Ceclor       RID  Cephalosporins    AVOID:  E-Mycins      Kwell  Keflex  Macrobid/Macrodantin   Diarrhea:  Penicillin      Kao-Pectate  Zithromax      Imodium AD         PUSH FLUIDS AVOID:       Cipro     Fever:  Tetracycline      Tylenol (Regular or Extra  Minocycline       Strength)  Levaquin      Extra Strength-Do not          Exceed 8 tabs/24 hrs Caffeine:        <200mg/day (equiv. To 1 cup of coffee or  approx. 3 12 oz sodas)         Gas: Cold/Hayfever:       Gas-X  Benadryl      Mylicon  Claritin       Phazyme  **Claritin-D        Chlor-Trimeton    Headaches:  Dimetapp      ASA-Free Excedrin  Drixoral-Non-Drowsy     Cold Compress  Mucinex (Guaifenasin)     Tylenol (Regular or Extra  Sudafed/Sudafed-12 Hour     Strength)  **Sudafed PE Pseudoephedrine   Tylenol Cold & Sinus     Vicks Vapor Rub  Zyrtec  **AVOID if Problems With Blood Pressure         Heartburn: Avoid lying down for at least 1 hour after meals  Aciphex      Maalox     Rash:  Milk of  Magnesia     Benadryl      Mylanta       1% Hydrocortisone Cream  Pepcid  Pepcid Complete   Sleep Aids:  Prevacid      Ambien   Prilosec       Benadryl  Rolaids       Chamomile Tea  Tums (Limit 4/day)     Unisom  Zantac       Tylenol PM         Warm milk-add vanilla or  Hemorrhoids:       Sugar for taste  Anusol/Anusol H.C.  (RX: Analapram 2.5%)  Sugar Substitutes:  Hydrocortisone OTC     Ok in moderation  Preparation H      Tucks        Vaseline lotion applied to tissue with wiping    Herpes:     Throat:  Acyclovir      Oragel  Famvir  Valtrex     Vaccines:         Flu Shot Leg Cramps:       *Gardasil  Benadryl      Hepatitis A         Hepatitis B Nasal Spray:       Pneumovax  Saline Nasal Spray     Polio Booster         Tetanus Nausea:       Tuberculosis test or PPD  Vitamin B6 25 mg TID   AVOID:    Dramamine      *Gardasil  Emetrol       Live Poliovirus  Ginger Root 250 mg QID    MMR (measles, mumps &  High Complex Carbs @ Bedtime    rebella)  Sea Bands-Accupressure    Varicella (Chickenpox)  Unisom 1/2 tab TID     *No known complications           If received before Pain:         Known pregnancy;   Darvocet       Resume series after  Lortab        Delivery  Percocet    Yeast:   Tramadol      Femstat  Tylenol 3      Gyne-lotrimin  Ultram       Monistat  Vicodin           MISC:         All Sunscreens           Hair Coloring/highlights          Insect Repellant's          (Including DEET)         Mystic Tans  

## 2019-06-29 LAB — URINALYSIS, ROUTINE W REFLEX MICROSCOPIC
Bilirubin, UA: NEGATIVE
Glucose, UA: NEGATIVE
Ketones, UA: NEGATIVE
Nitrite, UA: NEGATIVE
Protein,UA: NEGATIVE
Specific Gravity, UA: 1.011 (ref 1.005–1.030)
Urobilinogen, Ur: 0.2 mg/dL (ref 0.2–1.0)
pH, UA: 6.5 (ref 5.0–7.5)

## 2019-06-29 LAB — TSH: TSH: 0.643 u[IU]/mL (ref 0.450–4.500)

## 2019-06-29 LAB — MICROSCOPIC EXAMINATION
Casts: NONE SEEN /lpf
Epithelial Cells (non renal): >10 /hpf — AB (ref 0–10)

## 2019-06-29 LAB — HGB SOLU + RFLX FRAC: Sickle Solubility Test - HGBRFX: NEGATIVE

## 2019-06-29 LAB — ANTIBODY SCREEN

## 2019-06-29 LAB — DRUG PROFILE, UR, 9 DRUGS (LABCORP)
Amphetamines, Urine: NEGATIVE ng/mL
Barbiturate Quant, Ur: NEGATIVE ng/mL
Benzodiazepine Quant, Ur: NEGATIVE ng/mL
Cannabinoid Quant, Ur: NEGATIVE ng/mL
Cocaine (Metab.): NEGATIVE ng/mL
Methadone Screen, Urine: NEGATIVE ng/mL
Opiate Quant, Ur: NEGATIVE ng/mL
PCP Quant, Ur: NEGATIVE ng/mL
Propoxyphene: NEGATIVE ng/mL

## 2019-06-29 LAB — GC/CHLAMYDIA PROBE AMP
Chlamydia trachomatis, NAA: NEGATIVE
Neisseria Gonorrhoeae by PCR: NEGATIVE

## 2019-06-29 LAB — RPR: RPR Ser Ql: NONREACTIVE

## 2019-06-29 LAB — NICOTINE SCREEN, URINE: Cotinine Ql Scrn, Ur: NEGATIVE ng/mL

## 2019-06-29 LAB — RUBELLA SCREEN: Rubella Antibodies, IGG: 3.83 index (ref >0.99)

## 2019-06-29 LAB — TOXOPLASMA ANTIBODIES- IGG AND  IGM
Toxoplasma Antibody- IgM: <3 AU/mL (ref 0.0–7.9)
Toxoplasma IgG Ratio: <3 IU/mL (ref 0.0–7.1)

## 2019-06-29 LAB — AB SCR+ANTIBODY ID: Antibody Screen: POSITIVE — AB

## 2019-06-29 LAB — VARICELLA ZOSTER ANTIBODY, IGG: Varicella zoster IgG: 1783 index (ref >165)

## 2019-06-29 LAB — HIV ANTIBODY (ROUTINE TESTING W REFLEX): HIV Screen 4th Generation wRfx: NONREACTIVE

## 2019-06-29 LAB — HEMOGLOBIN A1C
Est. average glucose Bld gHb Est-mCnc: 97 mg/dL
Hgb A1c MFr Bld: 5 % (ref 4.8–5.6)

## 2019-06-29 LAB — HEPATITIS B SURFACE ANTIGEN: Hepatitis B Surface Ag: NEGATIVE

## 2019-06-30 LAB — CULTURE, OB URINE

## 2019-06-30 LAB — URINE CULTURE, OB REFLEX

## 2019-07-02 ENCOUNTER — Emergency Department: Payer: BC Managed Care – PPO

## 2019-07-02 ENCOUNTER — Other Ambulatory Visit: Payer: Self-pay

## 2019-07-02 ENCOUNTER — Emergency Department
Admission: EM | Admit: 2019-07-02 | Discharge: 2019-07-02 | Disposition: A | Discharge status: Home / Self Care | Payer: BC Managed Care – PPO | Attending: Emergency Medicine | Admitting: Emergency Medicine

## 2019-07-02 DIAGNOSIS — M549 Dorsalgia, unspecified: Secondary | ICD-10-CM | POA: Insufficient documentation

## 2019-07-02 DIAGNOSIS — N939 Abnormal uterine and vaginal bleeding, unspecified: Secondary | ICD-10-CM

## 2019-07-02 DIAGNOSIS — Z3A11 11 weeks gestation of pregnancy: Secondary | ICD-10-CM | POA: Diagnosis not present

## 2019-07-02 DIAGNOSIS — O219 Vomiting of pregnancy, unspecified: Secondary | ICD-10-CM | POA: Insufficient documentation

## 2019-07-02 DIAGNOSIS — Z7982 Long term (current) use of aspirin: Secondary | ICD-10-CM | POA: Diagnosis not present

## 2019-07-02 DIAGNOSIS — Z79899 Other long term (current) drug therapy: Secondary | ICD-10-CM | POA: Insufficient documentation

## 2019-07-02 DIAGNOSIS — R112 Nausea with vomiting, unspecified: Secondary | ICD-10-CM

## 2019-07-02 DIAGNOSIS — O4692 Antepartum hemorrhage, unspecified, second trimester: Secondary | ICD-10-CM | POA: Diagnosis not present

## 2019-07-02 DIAGNOSIS — O26892 Other specified pregnancy related conditions, second trimester: Secondary | ICD-10-CM | POA: Diagnosis not present

## 2019-07-02 DIAGNOSIS — Z3A13 13 weeks gestation of pregnancy: Secondary | ICD-10-CM | POA: Diagnosis not present

## 2019-07-02 DIAGNOSIS — O209 Hemorrhage in early pregnancy, unspecified: Secondary | ICD-10-CM | POA: Diagnosis not present

## 2019-07-02 LAB — CBC
HCT: 37.8 % (ref 36.0–46.0)
Hemoglobin: 12.2 g/dL (ref 12.0–15.0)
MCH: 26.8 pg (ref 26.0–34.0)
MCHC: 32.3 g/dL (ref 30.0–36.0)
MCV: 83.1 fL (ref 80.0–100.0)
Platelets: 407 10*3/uL — ABNORMAL HIGH (ref 150–400)
RBC: 4.55 MIL/uL (ref 3.87–5.11)
RDW: 16.4 % — ABNORMAL HIGH (ref 11.5–15.5)
WBC: 5.5 10*3/uL (ref 4.0–10.5)
nRBC: 0 % (ref 0.0–0.2)

## 2019-07-02 LAB — POCT PREGNANCY, URINE: Preg Test, Ur: POSITIVE — AB

## 2019-07-02 LAB — URINALYSIS, COMPLETE (UACMP) WITH MICROSCOPIC
Bacteria, UA: NONE SEEN
Bilirubin Urine: NEGATIVE
Glucose, UA: NEGATIVE mg/dL
Hgb urine dipstick: NEGATIVE
Ketones, ur: 80 mg/dL — AB
Nitrite: NEGATIVE
Protein, ur: 30 mg/dL — AB
Specific Gravity, Urine: 1.024 (ref 1.005–1.030)
pH: 5 (ref 5.0–8.0)

## 2019-07-02 LAB — COMPREHENSIVE METABOLIC PANEL
ALT: 11 U/L (ref 0–44)
AST: 18 U/L (ref 15–41)
Albumin: 4.1 g/dL (ref 3.5–5.0)
Alkaline Phosphatase: 87 U/L (ref 38–126)
Anion gap: 9 (ref 5–15)
BUN: 8 mg/dL (ref 6–20)
CO2: 22 mmol/L (ref 22–32)
Calcium: 8.7 mg/dL — ABNORMAL LOW (ref 8.9–10.3)
Chloride: 103 mmol/L (ref 98–111)
Creatinine, Ser: 0.61 mg/dL (ref 0.44–1.00)
GFR calc Af Amer: >60 mL/min (ref >60)
GFR calc non Af Amer: >60 mL/min (ref >60)
Glucose, Bld: 105 mg/dL — ABNORMAL HIGH (ref 70–99)
Potassium: 4 mmol/L (ref 3.5–5.1)
Sodium: 134 mmol/L — ABNORMAL LOW (ref 135–145)
Total Bilirubin: 0.6 mg/dL (ref 0.3–1.2)
Total Protein: 8.1 g/dL (ref 6.5–8.1)

## 2019-07-02 LAB — LIPASE, BLOOD: Lipase: 86 U/L — ABNORMAL HIGH (ref 11–51)

## 2019-07-02 MED ORDER — ONDANSETRON 4 MG PO TBDP: 4.0000 mg | ORAL_TABLET | Freq: Three times a day (TID) | ORAL | 0 refills | Status: DC | PRN

## 2019-07-02 MED ORDER — SODIUM CHLORIDE 0.9 % IV BOLUS
1000.0000 mL | Freq: Once | INTRAVENOUS | Status: AC
  Administered 2019-07-02: 1000 mL via INTRAVENOUS

## 2019-07-02 MED ORDER — ACETAMINOPHEN 500 MG PO TABS
1000.0000 mg | ORAL_TABLET | Freq: Once | ORAL | Status: AC
  Administered 2019-07-02: 1000 mg via ORAL
  Filled 2019-07-02: qty 2

## 2019-07-02 MED ORDER — ONDANSETRON HCL 4 MG/2ML IJ SOLN
4.0000 mg | Freq: Once | INTRAMUSCULAR | Status: AC
  Administered 2019-07-02: 4 mg via INTRAVENOUS
  Filled 2019-07-02: qty 2

## 2019-07-02 MED ORDER — SODIUM CHLORIDE 0.9% FLUSH: 3.0000 mL | Freq: Once | INTRAVENOUS | Status: DC

## 2019-07-02 MED ORDER — ACETAMINOPHEN 325 MG PO TABS
650.0000 mg | ORAL_TABLET | Freq: Once | ORAL | Status: AC
  Administered 2019-07-02: 650 mg via ORAL
  Filled 2019-07-02: qty 2

## 2019-07-02 NOTE — ED Triage Notes (Addendum)
Pt comes via POV from home with c/o vomiting and unable to keep anything down. Pt states she is [redacted] weeks pregnant.  Pt denies any belly pain.  Pt states back pain all over and bleeding on and off this week.

## 2019-07-02 NOTE — ED Triage Notes (Signed)
FIRST NURSE NOTE- here for vomiting in pregnancy.  Unable to keep anything down.  NAD

## 2019-07-02 NOTE — ED Provider Notes (Signed)
[facility] Emergency Department Provider Note  ____________________________________________   First MD Initiated Contact with Patient 07/02/19 1308     (approximate)  I have reviewed the triage vital signs and the nursing notes.   HISTORY  Chief Complaint Emesis    HPI Sherry Lynn is a 24 y.o. female who is [redacted] weeks pregnant who comes in for back pain and vomiting.  Patient had ultrasound done on 3/21 showing intrauterine pregnancy.  Patient states that she is continued to have intermittent vaginal bleeding, none today.  However today she endorses having some lower back pain across the entire lower back.  She also woke up and had nausea and vomiting multiple times this morning, nonbloody nonbilious, has not taken anything, nothing has made it worse.  Denies having this previously during pregnancy.  Denies any abdominal tenderness.          History reviewed. No pertinent past medical history.  There are no problems to display for this patient.   Past Surgical History:  Procedure Laterality Date  . CESAREAN SECTION    . PILONIDAL CYST EXCISION      Prior to Admission medications   Medication Sig Start Date End Date Taking? Authorizing Provider  aspirin EC 81 MG tablet Take 81 mg by mouth daily.    [provider]  Prenatal Vit-Fe Fumarate-FA (MULTIVITAMIN-PRENATAL) 27-0.8 MG TABS tablet Take 1 tablet by mouth daily at 12 noon.    [provider]    Allergies Other  Family History  Problem Relation Age of Onset  . Healthy Mother   . Hypertension Father   . Diabetes Father     Social History Social History   Tobacco Use  . Smoking status: Never Smoker  . Smokeless tobacco: Never Used  Substance Use Topics  . Alcohol use: Not Currently  . Drug use: Never      Review of Systems Constitutional: No fever/chills Eyes: No visual changes. ENT: No sore throat. Cardiovascular: Denies chest pain. Respiratory:  Denies shortness of breath. Gastrointestinal: No abdominal pain.  Positive nausea and vomiting no diarrhea.  No constipation. Genitourinary: Negative for dysuria. Musculoskeletal: Positive back pain Skin: Negative for rash. Neurological: Negative for headaches, focal weakness or numbness. All other ROS negative ____________________________________________   PHYSICAL EXAM:  VITAL SIGNS: ED Triage Vitals  Enc Vitals Group     BP 07/02/19 1221 122/75     Pulse Rate 07/02/19 1221 (!) 107     Resp 07/02/19 1221 18     Temp 07/02/19 1221 99.3 F (37.4 C)     Temp Source 07/02/19 1221 Oral     SpO2 07/02/19 1221 100 %     Weight --      Height --      Head Circumference --      Peak Flow --      Pain Score 07/02/19 1228 10     Pain Loc --      Pain Edu? --      Excl. in GC? --     Constitutional: Alert and oriented. Well appearing and in no acute distress. Eyes: Conjunctivae are normal. EOMI. Head: Atraumatic. Nose: No congestion/rhinnorhea. Mouth/Throat: Mucous membranes are moist.   Neck: No stridor. Trachea Midline. FROM Cardiovascular: Tachycardic, regular rhythm. Grossly normal heart sounds.  Good peripheral circulation. Respiratory: Normal respiratory effort.  No retractions. Lungs CTAB. Gastrointestinal: Soft and nontender. No distention. No abdominal bruits.  Musculoskeletal: No lower extremity tenderness nor edema.  No joint effusions.  Neurologic:  Normal speech and language. No gross focal neurologic deficits are appreciated.  Skin:  Skin is warm, dry and intact. No rash noted. Psychiatric: Mood and affect are normal. Speech and behavior are normal. GU: Deferred   ____________________________________________   LABS (all labs ordered are listed, but only abnormal results are displayed)  Labs Reviewed  LIPASE, BLOOD - Abnormal; Notable for the following components:      Result Value   Lipase 86 (*)    All other components within normal limits  COMPREHENSIVE  METABOLIC PANEL - Abnormal; Notable for the following components:   Sodium 134 (*)    Glucose, Bld 105 (*)    Calcium 8.7 (*)    All other components within normal limits  CBC - Abnormal; Notable for the following components:   RDW 16.4 (*)    Platelets 407 (*)    All other components within normal limits  URINALYSIS, COMPLETE (UACMP) WITH MICROSCOPIC - Abnormal; Notable for the following components:   Color, Urine YELLOW (*)    APPearance HAZY (*)    Ketones, ur 80 (*)    Protein, ur 30 (*)    Leukocytes,Ua SMALL (*)    All other components within normal limits  POCT PREGNANCY, URINE - Abnormal; Notable for the following components:   Preg Test, Ur POSITIVE (*)    All other components within normal limits  POC URINE PREG, ED   ____________________________________________    RADIOLOGY Robert Bellow, personally viewed and evaluated these images (plain radiographs) as part of my medical decision making, as well as reviewing the written report by the radiologist.  ED MD interpretation: Pending  Official radiology report(s): No results found.  ____________________________________________   PROCEDURES  Procedure(s) performed (including Critical Care):  Procedures   ____________________________________________   INITIAL IMPRESSION / ASSESSMENT AND PLAN / ED COURSE  Sherry Lynn was evaluated in Emergency Department on 07/02/2019 for the symptoms described in the history of present illness. She was evaluated in the context of the global COVID-19 pandemic, which necessitated consideration that the patient might be at risk for infection with the SARS-CoV-2 virus that causes COVID-19. Institutional protocols and algorithms that pertain to the evaluation of patients at risk for COVID-19 are in a state of rapid change based on information released by regulatory bodies including the CDC and federal and state organizations. These policies and algorithms were followed during the  patient's care in the ED.     Patient is a 24 year old who is slightly tachycardic comes in with nausea vomiting and back pain.  Will get labs to evaluate for electrolyte abnormalities, AKI.  Patient has no upper abdominal tenderness to suggest gallbladder pathology.  Her back pain is symmetric on both sides and I have low suspicion for pyelonephritis or for kidney stone.  Will give patient Zofran and fluids and get fetal heart tones given the report of some vaginal bleeding off and on.  Lipase is slightly elevated at 86 but patient is nontender over her pancreas.  Patient's urine does look concerning for some dehydration with 80 ketones.  No evidence of UTI.  Patient's nausea is better.  Unable to get fetal heart tones at bedside so patient will get ultrasound.  Patient handed off to oncoming team pending ultrasound, reassessment after medications and if feeling better and tolerating p.o. most likely discharge home      ____________________________________________   FINAL CLINICAL IMPRESSION(S) / ED DIAGNOSES   Final diagnoses:  Nausea and vomiting, intractability of vomiting  not specified, unspecified vomiting type  [redacted] weeks gestation of pregnancy      MEDICATIONS GIVEN DURING THIS VISIT:  Medications  sodium chloride flush (NS) 0.9 % injection 3 mL (3 mLs Intravenous Not Given 07/02/19 1335)  sodium chloride 0.9 % bolus 1,000 mL (has no administration in time range)  sodium chloride 0.9 % bolus 1,000 mL (1,000 mLs Intravenous New Bag/Given 07/02/19 1405)  ondansetron (ZOFRAN) injection 4 mg (4 mg Intravenous Given 07/02/19 1405)  acetaminophen (TYLENOL) tablet 1,000 mg (1,000 mg Oral Given 07/02/19 1400)     ED Discharge Orders         Ordered    ondansetron (ZOFRAN ODT) 4 MG disintegrating tablet  Every 8 hours PRN     07/02/19 1544           Note:  This document was prepared using Dragon voice recognition software and may include unintentional dictation errors.     Concha Se, MD 07/02/19 [phone]

## 2019-07-02 NOTE — Discharge Instructions (Addendum)
Take the nausea medicine.  Return to the ER if you develop worsening pain on one side of your body, shortness of breath, chest pain, worsening vomiting or any other concerns

## 2019-07-06 ENCOUNTER — Encounter: Payer: BC Managed Care – PPO | Admitting: Obstetrics and Gynecology

## 2019-07-13 ENCOUNTER — Encounter: Payer: Self-pay | Admitting: Obstetrics and Gynecology

## 2019-07-13 ENCOUNTER — Ambulatory Visit (INDEPENDENT_AMBULATORY_CARE_PROVIDER_SITE_OTHER): Payer: BC Managed Care – PPO | Admitting: Obstetrics and Gynecology

## 2019-07-13 ENCOUNTER — Other Ambulatory Visit: Payer: Self-pay

## 2019-07-13 VITALS — BP 129/83 | HR 82 | Wt 181.0 lb

## 2019-07-13 DIAGNOSIS — Z3A14 14 weeks gestation of pregnancy: Secondary | ICD-10-CM

## 2019-07-13 DIAGNOSIS — Z3492 Encounter for supervision of normal pregnancy, unspecified, second trimester: Secondary | ICD-10-CM

## 2019-07-13 LAB — POCT URINALYSIS DIPSTICK OB
Bilirubin, UA: NEGATIVE
Blood, UA: NEGATIVE
Glucose, UA: NEGATIVE
Ketones, UA: NEGATIVE
Nitrite, UA: NEGATIVE
Spec Grav, UA: 1.025 (ref 1.010–1.025)
Urobilinogen, UA: 0.2 E.U./dL
pH, UA: 6 (ref 5.0–8.0)

## 2019-07-13 NOTE — Progress Notes (Addendum)
NOB: Patient reports no further bleeding.  She did go to the emergency department for persistent nausea and vomiting and had a second ultrasound which revealed the continued presence of a subchorionic hemorrhage but normal pregnancy.  Nausea vomiting now resolved.  Patient desires genetic testing-maternity 21.    VBAC calculator 53%  Plan: AFP next visit  Physical examination General NAD, Conversant  HEENT Atraumatic; Op clear with mmm.  Normo-cephalic. Pupils reactive. Anicteric sclerae  Thyroid/Neck Smooth without nodularity or enlargement. Normal ROM.  Neck Supple.  Skin No rashes, lesions or ulceration. Normal palpated skin turgor. No nodularity.  Breasts: No masses or discharge.  Symmetric.  No axillary adenopathy.  Lungs: Clear to auscultation.No rales or wheezes. Normal Respiratory effort, no retractions.  Heart: NSR.  No murmurs or rubs appreciated. No periferal edema  Abdomen: Soft.  Non-tender.  No masses.  No HSM. No hernia  Extremities: Moves all appropriately.  Normal ROM for age. No lymphadenopathy.  Neuro: Oriented to PPT.  Normal mood. Normal affect.     Pelvic:   Vulva: Normal appearance.  No lesions.  Vagina: No lesions or abnormalities noted.  Support: Normal pelvic support.  Urethra No masses tenderness or scarring.  Meatus Normal size without lesions or prolapse.  Cervix: Normal appearance.  No lesions.  Anus: Normal exam.  No lesions.  Perineum: Normal exam.  No lesions.        Bimanual   Adnexae: No masses.  Non-tender to palpation.  Uterus: Enlarged. 15 wks  Pos FHT's  Non-tender.  Mobile.  AV.  Adnexae: No masses.  Non-tender to palpation.  Cul-de-sac: Negative for abnormality.  Adnexae: No masses.  Non-tender to palpation.

## 2019-07-18 LAB — MATERNIT21  PLUS CORE+ESS+SCA, BLOOD
11q23 deletion (Jacobsen): NOT DETECTED
15q11 deletion (PW Angelman): NOT DETECTED
1p36 deletion syndrome: NOT DETECTED
22q11 deletion (DiGeorge): NOT DETECTED
4p16 deletion(Wolf-Hirschhorn): NOT DETECTED
5p15 deletion (Cri-du-chat): NOT DETECTED
8q24 deletion (Langer-Giedion): NOT DETECTED
Fetal Fraction: 10
Monosomy X (Turner Syndrome): NOT DETECTED
Result (T21): NEGATIVE
Trisomy 13 (Patau syndrome): NEGATIVE
Trisomy 16: NOT DETECTED
Trisomy 18 (Edwards syndrome): NEGATIVE
Trisomy 21 (Down syndrome): NEGATIVE
Trisomy 22: NOT DETECTED
XXX (Triple X Syndrome): NOT DETECTED
XXY (Klinefelter Syndrome): NOT DETECTED
XYY (Jacobs Syndrome): NOT DETECTED

## 2019-08-03 ENCOUNTER — Other Ambulatory Visit: Payer: Self-pay | Admitting: Obstetrics and Gynecology

## 2019-08-03 ENCOUNTER — Ambulatory Visit (INDEPENDENT_AMBULATORY_CARE_PROVIDER_SITE_OTHER): Payer: BC Managed Care – PPO

## 2019-08-03 ENCOUNTER — Ambulatory Visit (INDEPENDENT_AMBULATORY_CARE_PROVIDER_SITE_OTHER): Payer: BC Managed Care – PPO | Admitting: Obstetrics and Gynecology

## 2019-08-03 ENCOUNTER — Encounter: Payer: Self-pay | Admitting: Obstetrics and Gynecology

## 2019-08-03 ENCOUNTER — Other Ambulatory Visit: Payer: Self-pay

## 2019-08-03 VITALS — BP 113/73 | HR 102 | Wt 181.6 lb

## 2019-08-03 DIAGNOSIS — Z09 Encounter for follow-up examination after completed treatment for conditions other than malignant neoplasm: Secondary | ICD-10-CM

## 2019-08-03 DIAGNOSIS — Z3A17 17 weeks gestation of pregnancy: Secondary | ICD-10-CM

## 2019-08-03 DIAGNOSIS — O09892 Supervision of other high risk pregnancies, second trimester: Secondary | ICD-10-CM

## 2019-08-03 DIAGNOSIS — O09893 Supervision of other high risk pregnancies, third trimester: Secondary | ICD-10-CM | POA: Insufficient documentation

## 2019-08-03 DIAGNOSIS — O09299 Supervision of pregnancy with other poor reproductive or obstetric history, unspecified trimester: Secondary | ICD-10-CM

## 2019-08-03 DIAGNOSIS — Z8759 Personal history of other complications of pregnancy, childbirth and the puerperium: Secondary | ICD-10-CM | POA: Insufficient documentation

## 2019-08-03 DIAGNOSIS — Z98891 History of uterine scar from previous surgery: Secondary | ICD-10-CM | POA: Insufficient documentation

## 2019-08-03 DIAGNOSIS — O09812 Supervision of pregnancy resulting from assisted reproductive technology, second trimester: Secondary | ICD-10-CM

## 2019-08-03 DIAGNOSIS — O0993 Supervision of high risk pregnancy, unspecified, third trimester: Secondary | ICD-10-CM | POA: Insufficient documentation

## 2019-08-03 DIAGNOSIS — O0992 Supervision of high risk pregnancy, unspecified, second trimester: Secondary | ICD-10-CM

## 2019-08-03 DIAGNOSIS — O34219 Maternal care for unspecified type scar from previous cesarean delivery: Secondary | ICD-10-CM

## 2019-08-03 LAB — POCT URINALYSIS DIPSTICK OB
Bilirubin, UA: NEGATIVE
Blood, UA: NEGATIVE
Glucose, UA: NEGATIVE
Nitrite, UA: NEGATIVE
Spec Grav, UA: >=1.03 — AB (ref 1.010–1.025)
Urobilinogen, UA: 0.2 E.U./dL
pH, UA: 6 (ref 5.0–8.0)

## 2019-08-03 NOTE — Patient Instructions (Signed)
WHAT OB PATIENTS CAN EXPECT   Confirmation of pregnancy and ultrasound ordered if medically indicated-[redacted] weeks gestation  New OB (NOB) intake with nurse and New OB (NOB) labs- [redacted] weeks gestation  New OB (NOB) physical examination with provider- 11/[redacted] weeks gestation  Flu vaccine-[redacted] weeks gestation  Anatomy scan-[redacted] weeks gestation  Glucose tolerance test, blood work to test for anemia, T-dap vaccine-[redacted] weeks gestation  Vaginal swabs/cultures-STD/Group B strep-[redacted] weeks gestation  Appointments every 4 weeks until 28 weeks  Every 2 weeks from 28 weeks until 36 weeks  Weekly visits from 36 weeks until delivery  Common Medications Safe in Pregnancy  Acne:      Constipation:  Benzoyl Peroxide     Colace  Clindamycin      Dulcolax Suppository  Topica Erythromycin     Fibercon  Salicylic Acid      Metamucil         Miralax AVOID:        Senakot   Accutane    Cough:  Retin-A       Cough Drops  Tetracycline      Phenergan w/ Codeine if Rx  Minocycline      Robitussin (Plain & DM)  Antibiotics:     Crabs/Lice:  Ceclor       RID  Cephalosporins    AVOID:  E-Mycins      Kwell  Keflex  Macrobid/Macrodantin   Diarrhea:  Penicillin      Kao-Pectate  Zithromax      Imodium AD         PUSH FLUIDS AVOID:       Cipro     Fever:  Tetracycline      Tylenol (Regular or Extra  Minocycline       Strength)  Levaquin      Extra Strength-Do not          Exceed 8 tabs/24 hrs Caffeine:        <253m/day (equiv. To 1 cup of coffee or  approx. 3 12 oz sodas)         Gas: Cold/Hayfever:       Gas-X  Benadryl      Mylicon  Claritin       Phazyme  **Claritin-D        Chlor-Trimeton    Headaches:  Dimetapp      ASA-Free Excedrin  Drixoral-Non-Drowsy     Cold Compress  Mucinex (Guaifenasin)     Tylenol (Regular or Extra  Sudafed/Sudafed-12 Hour     Strength)  **Sudafed PE Pseudoephedrine   Tylenol Cold & Sinus     Vicks Vapor Rub  Zyrtec  **AVOID if Problems With Blood  Pressure         Heartburn: Avoid lying down for at least 1 hour after meals  Aciphex      Maalox     Rash:  Milk of Magnesia     Benadryl    Mylanta       1% Hydrocortisone Cream  Pepcid  Pepcid Complete   Sleep Aids:  Prevacid      Ambien   Prilosec       Benadryl  Rolaids       Chamomile Tea  Tums (Limit 4/day)     Unisom  Zantac       Tylenol PM         Warm milk-add vanilla or  Hemorrhoids:       Sugar for taste  Anusol/Anusol H.C.  (RX: Analapram 2.5%)  Sugar Substitutes:  Hydrocortisone OTC     Ok in moderation  Preparation H      Tucks        Vaseline lotion applied to tissue with wiping    Herpes:     Throat:  Acyclovir      Oragel  Famvir  Valtrex     Vaccines:         Flu Shot Leg Cramps:       *Gardasil  Benadryl      Hepatitis A         Hepatitis B Nasal Spray:       Pneumovax  Saline Nasal Spray     Polio Booster         Tetanus Nausea:       Tuberculosis test or PPD  Vitamin B6 25 mg TID   AVOID:    Dramamine      *Gardasil  Emetrol       Live Poliovirus  Ginger Root 250 mg QID    MMR (measles, mumps &  High Complex Carbs @ Bedtime    rebella)  Sea Bands-Accupressure    Varicella (Chickenpox)  Unisom 1/2 tab TID     *No known complications           If received before Pain:         Known pregnancy;   Darvocet       Resume series after  Lortab        Delivery  Percocet    Yeast:   Tramadol      Femstat  Tylenol 3      Gyne-lotrimin  Ultram       Monistat  Vicodin           MISC:         All Sunscreens           Hair Coloring/highlights          Insect Repellant's          (Including DEET)         Mystic Tans Breastfeeding  Choosing to breastfeed is one of the best decisions you can make for yourself and your baby. A change in hormones during pregnancy causes your breasts to make breast milk in your milk-producing glands. Hormones prevent breast milk from being released before your baby is born. They also prompt milk flow after birth.  Once breastfeeding has begun, thoughts of your baby, as well as his or her sucking or crying, can stimulate the release of milk from your milk-producing glands. Benefits of breastfeeding Research shows that breastfeeding offers many health benefits for infants and mothers. It also offers a cost-free and convenient way to feed your baby. For your baby  Your first milk (colostrum) helps your baby's digestive system to function better.  Special cells in your milk (antibodies) help your baby to fight off infections.  Breastfed babies are less likely to develop asthma, allergies, obesity, or type 2 diabetes. They are also at lower risk for sudden infant death syndrome (SIDS).  Nutrients in breast milk are better able to meet your baby's needs compared to infant formula.  Breast milk improves your baby's brain development. For you  Breastfeeding helps to create a very special bond between you and your baby.  Breastfeeding is convenient. Breast milk costs nothing and is always available at the correct temperature.  Breastfeeding helps to burn calories. It helps you to lose the weight that you gained during pregnancy.  Breastfeeding  makes your uterus return faster to its size before pregnancy. It also slows bleeding (lochia) after you give birth.  Breastfeeding helps to lower your risk of developing type 2 diabetes, osteoporosis, rheumatoid arthritis, cardiovascular disease, and breast, ovarian, uterine, and endometrial cancer later in life. Breastfeeding basics Starting breastfeeding  Find a comfortable place to sit or lie down, with your neck and back well-supported.  Place a pillow or a rolled-up blanket under your baby to bring him or her to the level of your breast (if you are seated). Nursing pillows are specially designed to help support your arms and your baby while you breastfeed.  Make sure that your baby's tummy (abdomen) is facing your abdomen.  Gently massage your breast. With  your fingertips, massage from the outer edges of your breast inward toward the nipple. This encourages milk flow. If your milk flows slowly, you may need to continue this action during the feeding.  Support your breast with 4 fingers underneath and your thumb above your nipple (make the letter "C" with your hand). Make sure your fingers are well away from your nipple and your baby's mouth.  Stroke your baby's lips gently with your finger or nipple.  When your baby's mouth is open wide enough, quickly bring your baby to your breast, placing your entire nipple and as much of the areola as possible into your baby's mouth. The areola is the colored area around your nipple. ? More areola should be visible above your baby's upper lip than below the lower lip. ? Your baby's lips should be opened and extended outward (flanged) to ensure an adequate, comfortable latch. ? Your baby's tongue should be between his or her lower gum and your breast.  Make sure that your baby's mouth is correctly positioned around your nipple (latched). Your baby's lips should create a seal on your breast and be turned out (everted).  It is common for your baby to suck about 2-3 minutes in order to start the flow of breast milk. Latching Teaching your baby how to latch onto your breast properly is very important. An improper latch can cause nipple pain, decreased milk supply, and poor weight gain in your baby. Also, if your baby is not latched onto your nipple properly, he or she may swallow some air during feeding. This can make your baby fussy. Burping your baby when you switch breasts during the feeding can help to get rid of the air. However, teaching your baby to latch on properly is still the best way to prevent fussiness from swallowing air while breastfeeding. Signs that your baby has successfully latched onto your nipple  Silent tugging or silent sucking, without causing you pain. Infant's lips should be extended outward  (flanged).  Swallowing heard between every 3-4 sucks once your milk has started to flow (after your let-down milk reflex occurs).  Muscle movement above and in front of his or her ears while sucking. Signs that your baby has not successfully latched onto your nipple  Sucking sounds or smacking sounds from your baby while breastfeeding.  Nipple pain. If you think your baby has not latched on correctly, slip your finger into the corner of your baby's mouth to break the suction and place it between your baby's gums. Attempt to start breastfeeding again. Signs of successful breastfeeding Signs from your baby  Your baby will gradually decrease the number of sucks or will completely stop sucking.  Your baby will fall asleep.  Your baby's body will relax.  Your baby will retain a small amount of milk in his or her mouth.  Your baby will let go of your breast by himself or herself. Signs from you  Breasts that have increased in firmness, weight, and size 1-3 hours after feeding.  Breasts that are softer immediately after breastfeeding.  Increased milk volume, as well as a change in milk consistency and color by the fifth day of breastfeeding.  Nipples that are not sore, cracked, or bleeding. Signs that your baby is getting enough milk  Wetting at least 1-2 diapers during the first 24 hours after birth.  Wetting at least 5-6 diapers every 24 hours for the first week after birth. The urine should be clear or pale yellow by the age of 5 days.  Wetting 6-8 diapers every 24 hours as your baby continues to grow and develop.  At least 3 stools in a 24-hour period by the age of 5 days. The stool should be soft and yellow.  At least 3 stools in a 24-hour period by the age of 7 days. The stool should be seedy and yellow.  No loss of weight greater than 10% of birth weight during the first 3 days of life.  Average weight gain of 4-7 oz (113-198 g) per week after the age of 4  days.  Consistent daily weight gain by the age of 5 days, without weight loss after the age of 2 weeks. After a feeding, your baby may spit up a small amount of milk. This is normal. Breastfeeding frequency and duration Frequent feeding will help you make more milk and can prevent sore nipples and extremely full breasts (breast engorgement). Breastfeed when you feel the need to reduce the fullness of your breasts or when your baby shows signs of hunger. This is called "breastfeeding on demand." Signs that your baby is hungry include:  Increased alertness, activity, or restlessness.  Movement of the head from side to side.  Opening of the mouth when the corner of the mouth or cheek is stroked (rooting).  Increased sucking sounds, smacking lips, cooing, sighing, or squeaking.  Hand-to-mouth movements and sucking on fingers or hands.  Fussing or crying. Avoid introducing a pacifier to your baby in the first 4-6 weeks after your baby is born. After this time, you may choose to use a pacifier. Research has shown that pacifier use during the first year of a baby's life decreases the risk of sudden infant death syndrome (SIDS). Allow your baby to feed on each breast as long as he or she wants. When your baby unlatches or falls asleep while feeding from the first breast, offer the second breast. Because newborns are often sleepy in the first few weeks of life, you may need to awaken your baby to get him or her to feed. Breastfeeding times will vary from baby to baby. However, the following rules can serve as a guide to help you make sure that your baby is properly fed:  Newborns (babies 38 weeks of age or younger) may breastfeed every 1-3 hours.  Newborns should not go without breastfeeding for longer than 3 hours during the day or 5 hours during the night.  You should breastfeed your baby a minimum of 8 times in a 24-hour period. Breast milk pumping     Pumping and storing breast milk allows  you to make sure that your baby is exclusively fed your breast milk, even at times when you are unable to breastfeed. This is especially important if  you go back to work while you are still breastfeeding, or if you are not able to be present during feedings. Your lactation consultant can help you find a method of pumping that works best for you and give you guidelines about how long it is safe to store breast milk. Caring for your breasts while you breastfeed Nipples can become dry, cracked, and sore while breastfeeding. The following recommendations can help keep your breasts moisturized and healthy:  Avoid using soap on your nipples.  Wear a supportive bra designed especially for nursing. Avoid wearing underwire-style bras or extremely tight bras (sports bras).  Air-dry your nipples for 3-4 minutes after each feeding.  Use only cotton bra pads to absorb leaked breast milk. Leaking of breast milk between feedings is normal.  Use lanolin on your nipples after breastfeeding. Lanolin helps to maintain your skin's normal moisture barrier. Pure lanolin is not harmful (not toxic) to your baby. You may also hand express a few drops of breast milk and gently massage that milk into your nipples and allow the milk to air-dry. In the first few weeks after giving birth, some women experience breast engorgement. Engorgement can make your breasts feel heavy, warm, and tender to the touch. Engorgement peaks within 3-5 days after you give birth. The following recommendations can help to ease engorgement:  Completely empty your breasts while breastfeeding or pumping. You may want to start by applying warm, moist heat (in the shower or with warm, water-soaked hand towels) just before feeding or pumping. This increases circulation and helps the milk flow. If your baby does not completely empty your breasts while breastfeeding, pump any extra milk after he or she is finished.  Apply ice packs to your breasts  immediately after breastfeeding or pumping, unless this is too uncomfortable for you. To do this: ? Put ice in a plastic bag. ? Place a towel between your skin and the bag. ? Leave the ice on for 20 minutes, 2-3 times a day.  Make sure that your baby is latched on and positioned properly while breastfeeding. If engorgement persists after 48 hours of following these recommendations, contact your health care provider or a Science writer. Overall health care recommendations while breastfeeding  Eat 3 healthy meals and 3 snacks every day. Well-nourished mothers who are breastfeeding need an additional 450-500 calories a day. You can meet this requirement by increasing the amount of a balanced diet that you eat.  Drink enough water to keep your urine pale yellow or clear.  Rest often, relax, and continue to take your prenatal vitamins to prevent fatigue, stress, and low vitamin and mineral levels in your body (nutrient deficiencies).  Do not use any products that contain nicotine or tobacco, such as cigarettes and e-cigarettes. Your baby may be harmed by chemicals from cigarettes that pass into breast milk and exposure to secondhand smoke. If you need help quitting, ask your health care provider.  Avoid alcohol.  Do not use illegal drugs or marijuana.  Talk with your health care provider before taking any medicines. These include over-the-counter and prescription medicines as well as vitamins and herbal supplements. Some medicines that may be harmful to your baby can pass through breast milk.  It is possible to become pregnant while breastfeeding. If birth control is desired, ask your health care provider about options that will be safe while breastfeeding your baby. Where to find more information: [employer]: www.llli.org Contact a health care provider if:  You feel like  you want to stop breastfeeding or have become frustrated with breastfeeding.  Your nipples are  cracked or bleeding.  Your breasts are red, tender, or warm.  You have: ? Painful breasts or nipples. ? A swollen area on either breast. ? A fever or chills. ? Nausea or vomiting. ? Drainage other than breast milk from your nipples.  Your breasts do not become full before feedings by the fifth day after you give birth.  You feel sad and depressed.  Your baby is: ? Too sleepy to eat well. ? Having trouble sleeping. ? More than 69 week old and wetting fewer than 6 diapers in a 24-hour period. ? Not gaining weight by 34 days of age.  Your baby has fewer than 3 stools in a 24-hour period.  Your baby's skin or the white parts of his or her eyes become yellow. Get help right away if:  Your baby is overly tired (lethargic) and does not want to wake up and feed.  Your baby develops an unexplained fever. Summary  Breastfeeding offers many health benefits for infant and mothers.  Try to breastfeed your infant when he or she shows early signs of hunger.  Gently tickle or stroke your baby's lips with your finger or nipple to allow the baby to open his or her mouth. Bring the baby to your breast. Make sure that much of the areola is in your baby's mouth. Offer one side and burp the baby before you offer the other side.  Talk with your health care provider or lactation consultant if you have questions or you face problems as you breastfeed. This information is not intended to replace advice given to you by your health care provider. Make sure you discuss any questions you have with your health care provider. Document Revised: 06/12/2017 Document Reviewed: 04/19/2016 Elsevier Patient Education  [place].

## 2019-08-03 NOTE — Progress Notes (Signed)
Patient declined to talk to [employer] student about Ready, Set, Baby. Does not want to BF at this time.

## 2019-08-03 NOTE — Progress Notes (Signed)
ROB: Patient doing well today, no major complaints. S/p anatomy scan today, incomplete, will need f/u in 3-4 weeks. Discussed breastfeeding, patient very hesitant to consider as she notes difficulties after last pregnancy and traumatic birth(attempting to breastfeed preterm twins, low milk supply likely secondary to hemorrhage with blood transfusion due to urethral injury from catheter, C-section). Declined Ready Set Baby today.  Notes she will consider pumping/breastfeeding but concerned about keeping a steady regimen with 2 toddlers already in the home. Discussion had on time management, supplementing with formula as needed. Can continue to discuss concerns at future visits as well.    Patient also notes concerns with having another preterm birth. Discussed that last pregnancy she was planned for induction due to HELLP and twin gestation, so as long as she does not develop PIH this pregnancy, she should be ok to continue to term.  She states that during her IVF conception, she was told by her doctors during subsequent ultrasounds of concern for possible cervical dilation. Discussed that options for cerclage if cervical dilation is a concern but recent ultrasound does not note any issues with cervical incompetency. Is currently taking baby aspirin.   Desires to postpone AFP until next visit. RTC in 4 weeks for f/u anatomy scan and ROB.

## 2019-08-03 NOTE — Progress Notes (Signed)
ROB-Pt present for routine prenatal care. Pt stated that she was doing well no problems.  

## 2019-08-08 ENCOUNTER — Encounter: Payer: Self-pay | Admitting: Obstetrics and Gynecology

## 2019-08-31 ENCOUNTER — Other Ambulatory Visit: Payer: Self-pay

## 2019-08-31 ENCOUNTER — Ambulatory Visit (INDEPENDENT_AMBULATORY_CARE_PROVIDER_SITE_OTHER): Payer: BC Managed Care – PPO | Admitting: Obstetrics and Gynecology

## 2019-08-31 ENCOUNTER — Ambulatory Visit (INDEPENDENT_AMBULATORY_CARE_PROVIDER_SITE_OTHER): Payer: BC Managed Care – PPO

## 2019-08-31 ENCOUNTER — Encounter: Payer: Self-pay | Admitting: Obstetrics and Gynecology

## 2019-08-31 VITALS — BP 114/75 | HR 92 | Wt 187.9 lb

## 2019-08-31 DIAGNOSIS — O0992 Supervision of high risk pregnancy, unspecified, second trimester: Secondary | ICD-10-CM

## 2019-08-31 DIAGNOSIS — Z3A17 17 weeks gestation of pregnancy: Secondary | ICD-10-CM | POA: Diagnosis not present

## 2019-08-31 DIAGNOSIS — Z3A21 21 weeks gestation of pregnancy: Secondary | ICD-10-CM | POA: Diagnosis not present

## 2019-08-31 LAB — POCT URINALYSIS DIPSTICK OB
Bilirubin, UA: NEGATIVE
Blood, UA: NEGATIVE
Glucose, UA: NEGATIVE
Ketones, UA: NEGATIVE
Leukocytes, UA: NEGATIVE
Nitrite, UA: NEGATIVE
Spec Grav, UA: 1.01 (ref 1.010–1.025)
Urobilinogen, UA: 0.2 E.U./dL
pH, UA: 6.5 (ref 5.0–8.0)

## 2019-08-31 NOTE — Addendum Note (Signed)
Addended by: Dorian Pod on: 08/31/2019 02:28 PM   Modules accepted: Orders

## 2019-08-31 NOTE — Progress Notes (Signed)
ROB: Patient has no complaints.  Reports no further vaginal bleeding.  Taking aspirin as directed.  Now is considering TOLAC.  Also is considering breast-feeding although she has some trepidation.  Patient may desire lactation consultant she will consult ready set baby and decide about consultation by next visit.  AFP today.  The following were addressed during this visit:  Breastfeeding Education - Early initiation of breastfeeding    Comments: Keeps milk supply adequate, helps contract uterus and slow bleeding, and early milk is the perfect first food and is easy to digest.   - The importance of exclusive breastfeeding    Comments: Provides antibodies, Lower risk of breast and ovarian cancers, and type-2 diabetes,Helps your body recover, Reduced chance of SIDS.   - The importance of early skin-to-skin contact    Comments: Keeps baby warm and secure, helps keep babys blood sugar up and breathing steady, easier to bond and breastfeed, and helps calm baby.  - Rooming-in on a 24-hour basis    Comments: Easier to learn baby's feeding cues, easier to bond and get to know each other, and encourages milk production.

## 2019-09-02 LAB — AFP, SERUM, OPEN SPINA BIFIDA
AFP MoM: 1.78
AFP Value: 113.2 ng/mL
Gest. Age on Collection Date: 21 weeks
Maternal Age At EDD: 26 yr
OSBR Risk 1 IN: 2681
Test Results:: NEGATIVE
Weight: 187 [lb_av]

## 2019-09-28 ENCOUNTER — Ambulatory Visit (INDEPENDENT_AMBULATORY_CARE_PROVIDER_SITE_OTHER): Payer: BC Managed Care – PPO | Admitting: Obstetrics and Gynecology

## 2019-09-28 ENCOUNTER — Other Ambulatory Visit: Payer: Self-pay

## 2019-09-28 ENCOUNTER — Encounter: Payer: Self-pay | Admitting: Obstetrics and Gynecology

## 2019-09-28 VITALS — BP 96/62 | HR 94 | Wt 186.9 lb

## 2019-09-28 DIAGNOSIS — O09299 Supervision of pregnancy with other poor reproductive or obstetric history, unspecified trimester: Secondary | ICD-10-CM

## 2019-09-28 DIAGNOSIS — Z3A25 25 weeks gestation of pregnancy: Secondary | ICD-10-CM

## 2019-09-28 DIAGNOSIS — Z8759 Personal history of other complications of pregnancy, childbirth and the puerperium: Secondary | ICD-10-CM | POA: Insufficient documentation

## 2019-09-28 DIAGNOSIS — O09892 Supervision of other high risk pregnancies, second trimester: Secondary | ICD-10-CM

## 2019-09-28 DIAGNOSIS — Z3403 Encounter for supervision of normal first pregnancy, third trimester: Secondary | ICD-10-CM

## 2019-09-28 DIAGNOSIS — O34219 Maternal care for unspecified type scar from previous cesarean delivery: Secondary | ICD-10-CM | POA: Insufficient documentation

## 2019-09-28 DIAGNOSIS — O0992 Supervision of high risk pregnancy, unspecified, second trimester: Secondary | ICD-10-CM

## 2019-09-28 DIAGNOSIS — O09812 Supervision of pregnancy resulting from assisted reproductive technology, second trimester: Secondary | ICD-10-CM

## 2019-09-28 LAB — POCT URINALYSIS DIPSTICK OB
Bilirubin, UA: NEGATIVE
Blood, UA: NEGATIVE
Glucose, UA: NEGATIVE
Ketones, UA: NEGATIVE
Nitrite, UA: NEGATIVE
POC,PROTEIN,UA: NEGATIVE
Spec Grav, UA: 1.015 (ref 1.010–1.025)
Urobilinogen, UA: 0.2 E.U./dL
pH, UA: 6.5 (ref 5.0–8.0)

## 2019-09-28 NOTE — Patient Instructions (Signed)
Third Trimester of Pregnancy  The third trimester is from week 28 through week 40 (months 7 through 9). This trimester is when your unborn baby (fetus) is growing very fast. At the end of the ninth month, the unborn baby is about 20 inches in length. It weighs about 6-10 pounds. Follow these instructions at home: Medicines  Take over-the-counter and prescription medicines only as told by your doctor. Some medicines are safe and some medicines are not safe during pregnancy.  Take a prenatal vitamin that contains at least 600 micrograms (mcg) of folic acid.  If you have trouble pooping (constipation), take medicine that will make your stool soft (stool softener) if your doctor approves. Eating and drinking   Eat regular, healthy meals.  Avoid raw meat and uncooked cheese.  If you get low calcium from the food you eat, talk to your doctor about taking a daily calcium supplement.  Eat four or five small meals rather than three large meals a day.  Avoid foods that are high in fat and sugars, such as fried and sweet foods.  To prevent constipation: ? Eat foods that are high in fiber, like fresh fruits and vegetables, whole grains, and beans. ? Drink enough fluids to keep your pee (urine) clear or pale yellow. Activity  Exercise only as told by your doctor. Stop exercising if you start to have cramps.  Avoid heavy lifting, wear low heels, and sit up straight.  Do not exercise if it is too hot, too humid, or if you are in a place of great height (high altitude).  You may continue to have sex unless your doctor tells you not to. Relieving pain and discomfort  Wear a good support bra if your breasts are tender.  Take frequent breaks and rest with your legs raised if you have leg cramps or low back pain.  Take warm water baths (sitz baths) to soothe pain or discomfort caused by hemorrhoids. Use hemorrhoid cream if your doctor approves.  If you develop puffy, bulging veins (varicose  veins) in your legs: ? Wear support hose or compression stockings as told by your doctor. ? Raise (elevate) your feet for 15 minutes, 3-4 times a day. ? Limit salt in your food. Safety  Wear your seat belt when driving.  Make a list of emergency phone numbers, including numbers for family, friends, the hospital, and police and fire departments. Preparing for your baby's arrival To prepare for the arrival of your baby:  Take prenatal classes.  Practice driving to the hospital.  Visit the hospital and tour the maternity area.  Talk to your work about taking leave once the baby comes.  Pack your hospital bag.  Prepare the baby's room.  Go to your doctor visits.  Buy a rear-facing car seat. Learn how to install it in your car. General instructions  Do not use hot tubs, steam rooms, or saunas.  Do not use any products that contain nicotine or tobacco, such as cigarettes and e-cigarettes. If you need help quitting, ask your doctor.  Do not drink alcohol.  Do not douche or use tampons or scented sanitary pads.  Do not cross your legs for long periods of time.  Do not travel for long distances unless you must. Only do so if your doctor says it is okay.  Visit your dentist if you have not gone during your pregnancy. Use a soft toothbrush to brush your teeth. Be gentle when you floss.  Avoid cat litter boxes and soil  used by cats. These carry germs that can cause birth defects in the baby and can cause a loss of your baby (miscarriage) or stillbirth.  Keep all your prenatal visits as told by your doctor. This is important. Contact a doctor if:  You are not sure if you are in labor or if your water has broken.  You are dizzy.  You have mild cramps or pressure in your lower belly.  You have a nagging pain in your belly area.  You continue to feel sick to your stomach, you throw up, or you have watery poop.  You have bad smelling fluid coming from your vagina.  You have  pain when you pee. Get help right away if:  You have a fever.  You are leaking fluid from your vagina.  You are spotting or bleeding from your vagina.  You have severe belly cramps or pain.  You lose or gain weight quickly.  You have trouble catching your breath and have chest pain.  You notice sudden or extreme puffiness (swelling) of your face, hands, ankles, feet, or legs.  You have not felt the baby move in over an hour.  You have severe headaches that do not go away with medicine.  You have trouble seeing.  You are leaking, or you are having a gush of fluid, from your vagina before you are 37 weeks.  You have regular belly spasms (contractions) before you are 37 weeks. Summary  The third trimester is from week 28 through week 40 (months 7 through 9). This time is when your unborn baby is growing very fast.  Follow your doctor's advice about medicine, food, and activity.  Get ready for the arrival of your baby by taking prenatal classes, getting all the baby items ready, preparing the baby's room, and visiting your doctor to be checked.  Get help right away if you are bleeding from your vagina, or you have chest pain and trouble catching your breath, or if you have not felt your baby move in over an hour. This information is not intended to replace advice given to you by your health care provider. Make sure you discuss any questions you have with your health care provider. Document Revised: 07/09/2018 Document Reviewed: 04/23/2016 Elsevier Patient Education  [place]. Common Medications Safe in Pregnancy  Acne:      Constipation:  Benzoyl Peroxide     Colace  Clindamycin      Dulcolax Suppository  Topica Erythromycin     Fibercon  Salicylic Acid      Metamucil         Miralax AVOID:        Senakot   Accutane    Cough:  Retin-A       Cough Drops  Tetracycline      Phenergan w/ Codeine if Rx  Minocycline      Robitussin (Plain &  DM)  Antibiotics:     Crabs/Lice:  Ceclor       RID  Cephalosporins    AVOID:  E-Mycins      Kwell  Keflex  Macrobid/Macrodantin   Diarrhea:  Penicillin      Kao-Pectate  Zithromax      Imodium AD         PUSH FLUIDS AVOID:       Cipro     Fever:  Tetracycline      Tylenol (Regular or Extra  Minocycline       Strength)  Levaquin  Extra Strength-Do not          Exceed 8 tabs/24 hrs Caffeine:        <200mg/day (equiv. To 1 cup of coffee or  approx. 3 12 oz sodas)         Gas: Cold/Hayfever:       Gas-X  Benadryl      Mylicon  Claritin       Phazyme  **Claritin-D        Chlor-Trimeton    Headaches:  Dimetapp      ASA-Free Excedrin  Drixoral-Non-Drowsy     Cold Compress  Mucinex (Guaifenasin)     Tylenol (Regular or Extra  Sudafed/Sudafed-12 Hour     Strength)  **Sudafed PE Pseudoephedrine   Tylenol Cold & Sinus     Vicks Vapor Rub  Zyrtec  **AVOID if Problems With Blood Pressure         Heartburn: Avoid lying down for at least 1 hour after meals  Aciphex      Maalox     Rash:  Milk of Magnesia     Benadryl    Mylanta       1% Hydrocortisone Cream  Pepcid  Pepcid Complete   Sleep Aids:  Prevacid      Ambien   Prilosec       Benadryl  Rolaids       Chamomile Tea  Tums (Limit 4/day)     Unisom  Zantac       Tylenol PM         Warm milk-add vanilla or  Hemorrhoids:       Sugar for taste  Anusol/Anusol H.C.  (RX: Analapram 2.5%)  Sugar Substitutes:  Hydrocortisone OTC     Ok in moderation  Preparation H      Tucks        Vaseline lotion applied to tissue with wiping    Herpes:     Throat:  Acyclovir      Oragel  Famvir  Valtrex     Vaccines:         Flu Shot Leg Cramps:       *Gardasil  Benadryl      Hepatitis A         Hepatitis B Nasal Spray:       Pneumovax  Saline Nasal Spray     Polio Booster         Tetanus Nausea:       Tuberculosis test or PPD  Vitamin B6 25 mg TID   AVOID:    Dramamine      *Gardasil  Emetrol       Live  Poliovirus  Ginger Root 250 mg QID    MMR (measles, mumps &  High Complex Carbs @ Bedtime    rebella)  Sea Bands-Accupressure    Varicella (Chickenpox)  Unisom 1/2 tab TID     *No known complications           If received before Pain:         Known pregnancy;   Darvocet       Resume series after  Lortab        Delivery  Percocet    Yeast:   Tramadol      Femstat  Tylenol 3      Gyne-lotrimin  Ultram       Monistat  Vicodin           MISC:           All Sunscreens           Hair Coloring/highlights          Insect Repellant's          (Including DEET)         Mystic Tans Breastfeeding  Choosing to breastfeed is one of the best decisions you can make for yourself and your baby. A change in hormones during pregnancy causes your breasts to make breast milk in your milk-producing glands. Hormones prevent breast milk from being released before your baby is born. They also prompt milk flow after birth. Once breastfeeding has begun, thoughts of your baby, as well as his or her sucking or crying, can stimulate the release of milk from your milk-producing glands. Benefits of breastfeeding Research shows that breastfeeding offers many health benefits for infants and mothers. It also offers a cost-free and convenient way to feed your baby. For your baby  Your first milk (colostrum) helps your baby's digestive system to function better.  Special cells in your milk (antibodies) help your baby to fight off infections.  Breastfed babies are less likely to develop asthma, allergies, obesity, or type 2 diabetes. They are also at lower risk for sudden infant death syndrome (SIDS).  Nutrients in breast milk are better able to meet your baby's needs compared to infant formula.  Breast milk improves your baby's brain development. For you  Breastfeeding helps to create a very special bond between you and your baby.  Breastfeeding is convenient. Breast milk costs nothing and is always available at the  correct temperature.  Breastfeeding helps to burn calories. It helps you to lose the weight that you gained during pregnancy.  Breastfeeding makes your uterus return faster to its size before pregnancy. It also slows bleeding (lochia) after you give birth.  Breastfeeding helps to lower your risk of developing type 2 diabetes, osteoporosis, rheumatoid arthritis, cardiovascular disease, and breast, ovarian, uterine, and endometrial cancer later in life. Breastfeeding basics Starting breastfeeding  Find a comfortable place to sit or lie down, with your neck and back well-supported.  Place a pillow or a rolled-up blanket under your baby to bring him or her to the level of your breast (if you are seated). Nursing pillows are specially designed to help support your arms and your baby while you breastfeed.  Make sure that your baby's tummy (abdomen) is facing your abdomen.  Gently massage your breast. With your fingertips, massage from the outer edges of your breast inward toward the nipple. This encourages milk flow. If your milk flows slowly, you may need to continue this action during the feeding.  Support your breast with 4 fingers underneath and your thumb above your nipple (make the letter "C" with your hand). Make sure your fingers are well away from your nipple and your baby's mouth.  Stroke your baby's lips gently with your finger or nipple.  When your baby's mouth is open wide enough, quickly bring your baby to your breast, placing your entire nipple and as much of the areola as possible into your baby's mouth. The areola is the colored area around your nipple. ? More areola should be visible above your baby's upper lip than below the lower lip. ? Your baby's lips should be opened and extended outward (flanged) to ensure an adequate, comfortable latch. ? Your baby's tongue should be between his or her lower gum and your breast.  Make sure that your baby's mouth is correctly positioned  around your nipple (latched). Your  baby's lips should create a seal on your breast and be turned out (everted).  It is common for your baby to suck about 2-3 minutes in order to start the flow of breast milk. Latching Teaching your baby how to latch onto your breast properly is very important. An improper latch can cause nipple pain, decreased milk supply, and poor weight gain in your baby. Also, if your baby is not latched onto your nipple properly, he or she may swallow some air during feeding. This can make your baby fussy. Burping your baby when you switch breasts during the feeding can help to get rid of the air. However, teaching your baby to latch on properly is still the best way to prevent fussiness from swallowing air while breastfeeding. Signs that your baby has successfully latched onto your nipple  Silent tugging or silent sucking, without causing you pain. Infant's lips should be extended outward (flanged).  Swallowing heard between every 3-4 sucks once your milk has started to flow (after your let-down milk reflex occurs).  Muscle movement above and in front of his or her ears while sucking. Signs that your baby has not successfully latched onto your nipple  Sucking sounds or smacking sounds from your baby while breastfeeding.  Nipple pain. If you think your baby has not latched on correctly, slip your finger into the corner of your baby's mouth to break the suction and place it between your baby's gums. Attempt to start breastfeeding again. Signs of successful breastfeeding Signs from your baby  Your baby will gradually decrease the number of sucks or will completely stop sucking.  Your baby will fall asleep.  Your baby's body will relax.  Your baby will retain a small amount of milk in his or her mouth.  Your baby will let go of your breast by himself or herself. Signs from you  Breasts that have increased in firmness, weight, and size 1-3 hours after  feeding.  Breasts that are softer immediately after breastfeeding.  Increased milk volume, as well as a change in milk consistency and color by the fifth day of breastfeeding.  Nipples that are not sore, cracked, or bleeding. Signs that your baby is getting enough milk  Wetting at least 1-2 diapers during the first 24 hours after birth.  Wetting at least 5-6 diapers every 24 hours for the first week after birth. The urine should be clear or pale yellow by the age of 5 days.  Wetting 6-8 diapers every 24 hours as your baby continues to grow and develop.  At least 3 stools in a 24-hour period by the age of 5 days. The stool should be soft and yellow.  At least 3 stools in a 24-hour period by the age of 7 days. The stool should be seedy and yellow.  No loss of weight greater than 10% of birth weight during the first 3 days of life.  Average weight gain of 4-7 oz (113-198 g) per week after the age of 4 days.  Consistent daily weight gain by the age of 5 days, without weight loss after the age of 2 weeks. After a feeding, your baby may spit up a small amount of milk. This is normal. Breastfeeding frequency and duration Frequent feeding will help you make more milk and can prevent sore nipples and extremely full breasts (breast engorgement). Breastfeed when you feel the need to reduce the fullness of your breasts or when your baby shows signs of hunger. This is called "breastfeeding on   demand." Signs that your baby is hungry include:  Increased alertness, activity, or restlessness.  Movement of the head from side to side.  Opening of the mouth when the corner of the mouth or cheek is stroked (rooting).  Increased sucking sounds, smacking lips, cooing, sighing, or squeaking.  Hand-to-mouth movements and sucking on fingers or hands.  Fussing or crying. Avoid introducing a pacifier to your baby in the first 4-6 weeks after your baby is born. After this time, you may choose to use a  pacifier. Research has shown that pacifier use during the first year of a baby's life decreases the risk of sudden infant death syndrome (SIDS). Allow your baby to feed on each breast as long as he or she wants. When your baby unlatches or falls asleep while feeding from the first breast, offer the second breast. Because newborns are often sleepy in the first few weeks of life, you may need to awaken your baby to get him or her to feed. Breastfeeding times will vary from baby to baby. However, the following rules can serve as a guide to help you make sure that your baby is properly fed:  Newborns (babies 4 weeks of age or younger) may breastfeed every 1-3 hours.  Newborns should not go without breastfeeding for longer than 3 hours during the day or 5 hours during the night.  You should breastfeed your baby a minimum of 8 times in a 24-hour period. Breast milk pumping     Pumping and storing breast milk allows you to make sure that your baby is exclusively fed your breast milk, even at times when you are unable to breastfeed. This is especially important if you go back to work while you are still breastfeeding, or if you are not able to be present during feedings. Your lactation consultant can help you find a method of pumping that works best for you and give you guidelines about how long it is safe to store breast milk. Caring for your breasts while you breastfeed Nipples can become dry, cracked, and sore while breastfeeding. The following recommendations can help keep your breasts moisturized and healthy:  Avoid using soap on your nipples.  Wear a supportive bra designed especially for nursing. Avoid wearing underwire-style bras or extremely tight bras (sports bras).  Air-dry your nipples for 3-4 minutes after each feeding.  Use only cotton bra pads to absorb leaked breast milk. Leaking of breast milk between feedings is normal.  Use lanolin on your nipples after breastfeeding. Lanolin  helps to maintain your skin's normal moisture barrier. Pure lanolin is not harmful (not toxic) to your baby. You may also hand express a few drops of breast milk and gently massage that milk into your nipples and allow the milk to air-dry. In the first few weeks after giving birth, some women experience breast engorgement. Engorgement can make your breasts feel heavy, warm, and tender to the touch. Engorgement peaks within 3-5 days after you give birth. The following recommendations can help to ease engorgement:  Completely empty your breasts while breastfeeding or pumping. You may want to start by applying warm, moist heat (in the shower or with warm, water-soaked hand towels) just before feeding or pumping. This increases circulation and helps the milk flow. If your baby does not completely empty your breasts while breastfeeding, pump any extra milk after he or she is finished.  Apply ice packs to your breasts immediately after breastfeeding or pumping, unless this is too uncomfortable for you. To   do this: ? Put ice in a plastic bag. ? Place a towel between your skin and the bag. ? Leave the ice on for 20 minutes, 2-3 times a day.  Make sure that your baby is latched on and positioned properly while breastfeeding. If engorgement persists after 48 hours of following these recommendations, contact your health care provider or a Science writer. Overall health care recommendations while breastfeeding  Eat 3 healthy meals and 3 snacks every day. Well-nourished mothers who are breastfeeding need an additional 450-500 calories a day. You can meet this requirement by increasing the amount of a balanced diet that you eat.  Drink enough water to keep your urine pale yellow or clear.  Rest often, relax, and continue to take your prenatal vitamins to prevent fatigue, stress, and low vitamin and mineral levels in your body (nutrient deficiencies).  Do not use any products that contain nicotine or  tobacco, such as cigarettes and e-cigarettes. Your baby may be harmed by chemicals from cigarettes that pass into breast milk and exposure to secondhand smoke. If you need help quitting, ask your health care provider.  Avoid alcohol.  Do not use illegal drugs or marijuana.  Talk with your health care provider before taking any medicines. These include over-the-counter and prescription medicines as well as vitamins and herbal supplements. Some medicines that may be harmful to your baby can pass through breast milk.  It is possible to become pregnant while breastfeeding. If birth control is desired, ask your health care provider about options that will be safe while breastfeeding your baby. Where to find more information: [employer]: www.llli.org Contact a health care provider if:  You feel like you want to stop breastfeeding or have become frustrated with breastfeeding.  Your nipples are cracked or bleeding.  Your breasts are red, tender, or warm.  You have: ? Painful breasts or nipples. ? A swollen area on either breast. ? A fever or chills. ? Nausea or vomiting. ? Drainage other than breast milk from your nipples.  Your breasts do not become full before feedings by the fifth day after you give birth.  You feel sad and depressed.  Your baby is: ? Too sleepy to eat well. ? Having trouble sleeping. ? More than 3 week old and wetting fewer than 6 diapers in a 24-hour period. ? Not gaining weight by 61 days of age.  Your baby has fewer than 3 stools in a 24-hour period.  Your baby's skin or the white parts of his or her eyes become yellow. Get help right away if:  Your baby is overly tired (lethargic) and does not want to wake up and feed.  Your baby develops an unexplained fever. Summary  Breastfeeding offers many health benefits for infant and mothers.  Try to breastfeed your infant when he or she shows early signs of hunger.  Gently tickle or stroke  your baby's lips with your finger or nipple to allow the baby to open his or her mouth. Bring the baby to your breast. Make sure that much of the areola is in your baby's mouth. Offer one side and burp the baby before you offer the other side.  Talk with your health care provider or lactation consultant if you have questions or you face problems as you breastfeed. This information is not intended to replace advice given to you by your health care provider. Make sure you discuss any questions you have with your health care provider. Document Revised: 06/12/2017  Document Reviewed: 04/19/2016 Elsevier Patient Education  [employer].

## 2019-09-28 NOTE — Progress Notes (Signed)
Patient doing well overall.  Is concerned regarding low BP today. Given reassurance that this was normal in 2nd trimester and would return to normal by 3rd trimester. Is asymptomatic. Discussed need for glucola testing next visit. Discussed dietary intake, patient only consuming 1 meal daily (usually cereal but sometime [employer]).  Notes this pregnancy has been very selective with regards to food choices, and has not had much of an appetite. Does not like milkshakes, and can't consume nuts due to allergies. TWG so far is 16 lbs (with 1 lb weight loss since last visit).  Discussed nutritional supplements if need arises. RTC in 3 weeks.   The following were addressed during this visit:  Breastfeeding Education - Risks of giving your baby anything other than breast milk if you are breastfeeding    Comments: Make the baby less content with breastfeeds, may make my baby more susceptible to illness, and may reduce my milk supply.   - Feeding on demand or baby-led feeding    Comments: Helps prevent breastfeeding complications, helps bring in good milk supply, prevents under or overfeeding, and helps baby feel content and satisfied   - Frequent feeding to help assure optimal milk production    Comments: Making a full supply of milk requires frequent removal of milk from breasts, infant will eat 8-12 times in 24 hours, if separated from infant use breast massage, hand expression and/ or pumping to remove milk from breasts.   - Effective positioning and attachment    Comments: Helps my baby to get enough breast milk, helps to produce an adequate milk supply, and helps prevent nipple pain and damage   - Exclusive breastfeeding for the first 6 months    Comments: Builds a healthy milk supply and keeps it up, protects baby from sickness and disease, and breastmilk has everything your baby needs for the first 6 months.

## 2019-09-28 NOTE — Progress Notes (Signed)
ROB-Pt present for routine prenatal care. Pt stated that she was doing well no problems.  

## 2019-10-18 ENCOUNTER — Other Ambulatory Visit: Payer: Self-pay

## 2019-10-18 ENCOUNTER — Observation Stay
Admission: EM | Admit: 2019-10-18 | Discharge: 2019-10-18 | Disposition: A | Discharge status: Home / Self Care | Payer: BC Managed Care – PPO | Attending: Obstetrics and Gynecology | Admitting: Obstetrics and Gynecology

## 2019-10-18 ENCOUNTER — Encounter: Payer: Self-pay | Admitting: Obstetrics and Gynecology

## 2019-10-18 DIAGNOSIS — O99891 Other specified diseases and conditions complicating pregnancy: Secondary | ICD-10-CM | POA: Diagnosis not present

## 2019-10-18 DIAGNOSIS — M549 Dorsalgia, unspecified: Secondary | ICD-10-CM

## 2019-10-18 DIAGNOSIS — O26893 Other specified pregnancy related conditions, third trimester: Secondary | ICD-10-CM | POA: Diagnosis not present

## 2019-10-18 DIAGNOSIS — O09893 Supervision of other high risk pregnancies, third trimester: Secondary | ICD-10-CM | POA: Diagnosis not present

## 2019-10-18 DIAGNOSIS — O34219 Maternal care for unspecified type scar from previous cesarean delivery: Secondary | ICD-10-CM

## 2019-10-18 DIAGNOSIS — Z3A28 28 weeks gestation of pregnancy: Secondary | ICD-10-CM | POA: Insufficient documentation

## 2019-10-18 DIAGNOSIS — Z98891 History of uterine scar from previous surgery: Secondary | ICD-10-CM | POA: Diagnosis present

## 2019-10-18 LAB — URINALYSIS, COMPLETE (UACMP) WITH MICROSCOPIC
Bacteria, UA: NONE SEEN
Bilirubin Urine: NEGATIVE
Glucose, UA: 50 mg/dL — AB
Hgb urine dipstick: NEGATIVE
Ketones, ur: NEGATIVE mg/dL
Nitrite: NEGATIVE
Protein, ur: NEGATIVE mg/dL
Specific Gravity, Urine: 1.017 (ref 1.005–1.030)
pH: 6 (ref 5.0–8.0)

## 2019-10-18 LAB — WET PREP, GENITAL
Clue Cells Wet Prep HPF POC: NONE SEEN
Sperm: NONE SEEN
Trich, Wet Prep: NONE SEEN
Yeast Wet Prep HPF POC: NONE SEEN

## 2019-10-18 MED ORDER — ACETAMINOPHEN 325 MG PO TABS
ORAL_TABLET | ORAL | Status: AC
  Filled 2019-10-18: qty 2

## 2019-10-18 MED ORDER — ACETAMINOPHEN 325 MG PO TABS: 650.0000 mg | ORAL_TABLET | Freq: Four times a day (QID) | ORAL | Status: DC | PRN

## 2019-10-18 NOTE — Progress Notes (Signed)
RN reviewed lab results with patient and patient denies feeling anymore back pain in triage. RN offered Tylenol and patient declined stating "that she was not currently having any pain". Patient asked RN if she could have a cervical exam performed as she is anxious going home given her premature dilation in her previous pregnancy. RN educated patient and RN spoke to MD regarding this request and MD gave order for RN to check cervix. RN performed gentle vaginal exam and on examination patient's cervix is closed.

## 2019-10-18 NOTE — OB Triage Note (Signed)
Patient [phone] [redacted]w[redacted]d presents to Labor and Delivery for back discomfort. She reports waking up with back discomfort this morning and states it has been happening since then. She reports a clear, watery discharge as well. She denies vaginal bleeding and reports positive fetal movement. Vital signs are stable and monitors applied and assessing.

## 2019-10-18 NOTE — Final Progress Note (Signed)
L&D OB Triage Note  Sherry Lynn is a 24 y.o. [id number] female at [redacted]w[redacted]d, EDD Estimated Date of Delivery: 01/05/20 who presented to triage for complaints of back pain in pregnancy ongoing since last night.  She was evaluated by the nurses with no significant findings. Vital signs stable. An NST was performed and has been reviewed by MD. She was offered Tylenol for her back pain but patient declined.    NST INTERPRETATION: Indications: rule out uterine contractions  Mode: External Baseline Rate (A): 140 bpm Variability: Moderate Accelerations: 15 x 15 Decelerations: None     Contraction Frequency (min): UI  Impression: reactive   Labs:  Results for orders placed or performed during the hospital encounter of 10/18/19  Wet prep, genital  Result Value Ref Range   Yeast Wet Prep HPF POC NONE SEEN NONE SEEN   Trich, Wet Prep NONE SEEN NONE SEEN   Clue Cells Wet Prep HPF POC NONE SEEN NONE SEEN   WBC, Wet Prep HPF POC MODERATE (A) NONE SEEN   Sperm NONE SEEN   Urinalysis, Complete w Microscopic  Result Value Ref Range   Color, Urine YELLOW (A) YELLOW   APPearance CLOUDY (A) CLEAR   Specific Gravity, Urine 1.017 1.005 - 1.030   pH 6.0 5.0 - 8.0   Glucose, UA 50 (A) NEGATIVE mg/dL   Hgb urine dipstick NEGATIVE NEGATIVE   Bilirubin Urine NEGATIVE NEGATIVE   Ketones, ur NEGATIVE NEGATIVE mg/dL   Protein, ur NEGATIVE NEGATIVE mg/dL   Nitrite NEGATIVE NEGATIVE   Leukocytes,Ua LARGE (A) NEGATIVE   RBC / HPF 0-5 0 - 5 RBC/hpf   WBC, UA 21-50 0 - 5 WBC/hpf   Bacteria, UA NONE SEEN NONE SEEN   Squamous Epithelial / LPF 11-20 0 - 5   Mucus PRESENT     Plan: NST performed was reviewed and was found to be reactive. She was discharged home with bleeding/preterm labor precautions.  Continue routine prenatal care. Follow up with OB/GYN as previously scheduled.     Hildred Laser, MD  [facility]

## 2019-10-18 NOTE — Progress Notes (Signed)
RN reviewed discharge instructions with patient and discussed follow up care. RN answered all questions and patient verbalized understanding.

## 2019-10-20 ENCOUNTER — Other Ambulatory Visit: Payer: BC Managed Care – PPO

## 2019-10-20 NOTE — Addendum Note (Signed)
Addended by: Silvano Bilis on: 10/20/2019 03:52 PM   Modules accepted: Orders

## 2019-10-21 ENCOUNTER — Other Ambulatory Visit: Payer: Self-pay

## 2019-10-21 ENCOUNTER — Encounter: Payer: Self-pay | Admitting: Obstetrics and Gynecology

## 2019-10-21 ENCOUNTER — Ambulatory Visit (INDEPENDENT_AMBULATORY_CARE_PROVIDER_SITE_OTHER): Payer: BC Managed Care – PPO | Admitting: Obstetrics and Gynecology

## 2019-10-21 ENCOUNTER — Other Ambulatory Visit: Payer: BC Managed Care – PPO

## 2019-10-21 VITALS — BP 124/76 | HR 109 | Wt 186.1 lb

## 2019-10-21 DIAGNOSIS — O0993 Supervision of high risk pregnancy, unspecified, third trimester: Secondary | ICD-10-CM | POA: Diagnosis not present

## 2019-10-21 DIAGNOSIS — Z23 Encounter for immunization: Secondary | ICD-10-CM | POA: Diagnosis not present

## 2019-10-21 DIAGNOSIS — Z3403 Encounter for supervision of normal first pregnancy, third trimester: Secondary | ICD-10-CM

## 2019-10-21 DIAGNOSIS — Z3A29 29 weeks gestation of pregnancy: Secondary | ICD-10-CM | POA: Diagnosis not present

## 2019-10-21 NOTE — Addendum Note (Signed)
Addended by: Dorian Pod on: 10/21/2019 03:31 PM   Modules accepted: Orders

## 2019-10-21 NOTE — Progress Notes (Signed)
ROB: Patient still only able to eat 1 meal per day.  Has had limited weight gain during the pregnancy.  We have discussed this in some detail but have not come to any great solution.  Consideration for future ultrasound for growth.  Taking prenatal vitamins as directed.  1 hour GCT today.  Future NSTs beginning at 36 weeks(IVF) discussed. Breast-feeding again reviewed.  Importance of exclusive breast-feeding, rooming in, skin to skin contact, exclusive breast-feeding, lactation consultants available, benefits of breast-feeding to both baby and mother.

## 2019-10-22 LAB — RPR: RPR Ser Ql: NONREACTIVE

## 2019-10-22 LAB — CBC
Hematocrit: 31 % — ABNORMAL LOW (ref 34.0–46.6)
Hemoglobin: 9.9 g/dL — ABNORMAL LOW (ref 11.1–15.9)
MCH: 26.3 pg — ABNORMAL LOW (ref 26.6–33.0)
MCHC: 31.9 g/dL (ref 31.5–35.7)
MCV: 82 fL (ref 79–97)
Platelets: 348 10*3/uL (ref 150–450)
RBC: 3.77 x10E6/uL (ref 3.77–5.28)
RDW: 13.8 % (ref 11.7–15.4)
WBC: 7.4 10*3/uL (ref 3.4–10.8)

## 2019-10-22 LAB — GLUCOSE, 1 HOUR GESTATIONAL: Gestational Diabetes Screen: 85 mg/dL (ref 65–139)

## 2019-11-12 ENCOUNTER — Other Ambulatory Visit: Payer: Self-pay

## 2019-11-12 ENCOUNTER — Encounter: Payer: Self-pay | Admitting: Obstetrics and Gynecology

## 2019-11-12 ENCOUNTER — Ambulatory Visit (INDEPENDENT_AMBULATORY_CARE_PROVIDER_SITE_OTHER): Payer: BC Managed Care – PPO | Admitting: Obstetrics and Gynecology

## 2019-11-12 VITALS — BP 116/78 | HR 91 | Wt 186.0 lb

## 2019-11-12 DIAGNOSIS — O0993 Supervision of high risk pregnancy, unspecified, third trimester: Secondary | ICD-10-CM

## 2019-11-12 DIAGNOSIS — O2613 Low weight gain in pregnancy, third trimester: Secondary | ICD-10-CM

## 2019-11-12 DIAGNOSIS — Z8639 Personal history of other endocrine, nutritional and metabolic disease: Secondary | ICD-10-CM

## 2019-11-12 DIAGNOSIS — O34219 Maternal care for unspecified type scar from previous cesarean delivery: Secondary | ICD-10-CM

## 2019-11-12 DIAGNOSIS — D508 Other iron deficiency anemias: Secondary | ICD-10-CM

## 2019-11-12 DIAGNOSIS — Z3A32 32 weeks gestation of pregnancy: Secondary | ICD-10-CM

## 2019-11-12 LAB — POCT URINALYSIS DIPSTICK OB
Bilirubin, UA: NEGATIVE
Blood, UA: NEGATIVE
Glucose, UA: NEGATIVE
Ketones, UA: NEGATIVE
Nitrite, UA: NEGATIVE
POC,PROTEIN,UA: NEGATIVE
Spec Grav, UA: 1.015 (ref 1.010–1.025)
Urobilinogen, UA: 0.2 E.U./dL
pH, UA: 6 (ref 5.0–8.0)

## 2019-11-12 MED ORDER — FERRALET 90 90-1 MG PO TABS: 1.0000 | ORAL_TABLET | Freq: Every day | ORAL | 3 refills | Status: DC

## 2019-11-12 MED ORDER — VITAMIN D (ERGOCALCIFEROL) 1.25 MG (50000 UNIT) PO CAPS
50000.0000 [IU] | ORAL_CAPSULE | ORAL | 0 refills | Status: DC
Start: 2019-11-12 — End: 2020-01-24

## 2019-11-12 NOTE — Patient Instructions (Incomplete)
Breastfeeding  Choosing to breastfeed is one of the best decisions you can make for yourself and your baby. A change in hormones during pregnancy causes your breasts to make breast milk in your milk-producing glands. Hormones prevent breast milk from being released before your baby is born. They also prompt milk flow after birth. Once breastfeeding has begun, thoughts of your baby, as well as his or her sucking or crying, can stimulate the release of milk from your milk-producing glands. Benefits of breastfeeding Research shows that breastfeeding offers many health benefits for infants and mothers. It also offers a cost-free and convenient way to feed your baby. For your baby  Your first milk (colostrum) helps your baby's digestive system to function better.  Special cells in your milk (antibodies) help your baby to fight off infections.  Breastfed babies are less likely to develop asthma, allergies, obesity, or type 2 diabetes. They are also at lower risk for sudden infant death syndrome (SIDS).  Nutrients in breast milk are better able to meet your baby's needs compared to infant formula.  Breast milk improves your baby's brain development. For you  Breastfeeding helps to create a very special bond between you and your baby.  Breastfeeding is convenient. Breast milk costs nothing and is always available at the correct temperature.  Breastfeeding helps to burn calories. It helps you to lose the weight that you gained during pregnancy.  Breastfeeding makes your uterus return faster to its size before pregnancy. It also slows bleeding (lochia) after you give birth.  Breastfeeding helps to lower your risk of developing type 2 diabetes, osteoporosis, rheumatoid arthritis, cardiovascular disease, and breast, ovarian, uterine, and endometrial cancer later in life. Breastfeeding basics Starting breastfeeding  Find a comfortable place to sit or lie down, with your neck and back  well-supported.  Place a pillow or a rolled-up blanket under your baby to bring him or her to the level of your breast (if you are seated). Nursing pillows are specially designed to help support your arms and your baby while you breastfeed.  Make sure that your baby's tummy (abdomen) is facing your abdomen.  Gently massage your breast. With your fingertips, massage from the outer edges of your breast inward toward the nipple. This encourages milk flow. If your milk flows slowly, you may need to continue this action during the feeding.  Support your breast with 4 fingers underneath and your thumb above your nipple (make the letter "C" with your hand). Make sure your fingers are well away from your nipple and your baby's mouth.  Stroke your baby's lips gently with your finger or nipple.  When your baby's mouth is open wide enough, quickly bring your baby to your breast, placing your entire nipple and as much of the areola as possible into your baby's mouth. The areola is the colored area around your nipple. ? More areola should be visible above your baby's upper lip than below the lower lip. ? Your baby's lips should be opened and extended outward (flanged) to ensure an adequate, comfortable latch. ? Your baby's tongue should be between his or her lower gum and your breast.  Make sure that your baby's mouth is correctly positioned around your nipple (latched). Your baby's lips should create a seal on your breast and be turned out (everted).  It is common for your baby to suck about 2-3 minutes in order to start the flow of breast milk. Latching Teaching your baby how to latch onto your breast properly is  very important. An improper latch can cause nipple pain, decreased milk supply, and poor weight gain in your baby. Also, if your baby is not latched onto your nipple properly, he or she may swallow some air during feeding. This can make your baby fussy. Burping your baby when you switch breasts  during the feeding can help to get rid of the air. However, teaching your baby to latch on properly is still the best way to prevent fussiness from swallowing air while breastfeeding. Signs that your baby has successfully latched onto your nipple  Silent tugging or silent sucking, without causing you pain. Infant's lips should be extended outward (flanged).  Swallowing heard between every 3-4 sucks once your milk has started to flow (after your let-down milk reflex occurs).  Muscle movement above and in front of his or her ears while sucking. Signs that your baby has not successfully latched onto your nipple  Sucking sounds or smacking sounds from your baby while breastfeeding.  Nipple pain. If you think your baby has not latched on correctly, slip your finger into the corner of your baby's mouth to break the suction and place it between your baby's gums. Attempt to start breastfeeding again. Signs of successful breastfeeding Signs from your baby  Your baby will gradually decrease the number of sucks or will completely stop sucking.  Your baby will fall asleep.  Your baby's body will relax.  Your baby will retain a small amount of milk in his or her mouth.  Your baby will let go of your breast by himself or herself. Signs from you  Breasts that have increased in firmness, weight, and size 1-3 hours after feeding.  Breasts that are softer immediately after breastfeeding.  Increased milk volume, as well as a change in milk consistency and color by the fifth day of breastfeeding.  Nipples that are not sore, cracked, or bleeding. Signs that your baby is getting enough milk  Wetting at least 1-2 diapers during the first 24 hours after birth.  Wetting at least 5-6 diapers every 24 hours for the first week after birth. The urine should be clear or pale yellow by the age of 5 days.  Wetting 6-8 diapers every 24 hours as your baby continues to grow and develop.  At least 3 stools in  a 24-hour period by the age of 5 days. The stool should be soft and yellow.  At least 3 stools in a 24-hour period by the age of 7 days. The stool should be seedy and yellow.  No loss of weight greater than 10% of birth weight during the first 3 days of life.  Average weight gain of 4-7 oz (113-198 g) per week after the age of 4 days.  Consistent daily weight gain by the age of 5 days, without weight loss after the age of 2 weeks. After a feeding, your baby may spit up a small amount of milk. This is normal. Breastfeeding frequency and duration Frequent feeding will help you make more milk and can prevent sore nipples and extremely full breasts (breast engorgement). Breastfeed when you feel the need to reduce the fullness of your breasts or when your baby shows signs of hunger. This is called "breastfeeding on demand." Signs that your baby is hungry include:  Increased alertness, activity, or restlessness.  Movement of the head from side to side.  Opening of the mouth when the corner of the mouth or cheek is stroked (rooting).  Increased sucking sounds, smacking lips, cooing,  sighing, or squeaking.  Hand-to-mouth movements and sucking on fingers or hands.  Fussing or crying. Avoid introducing a pacifier to your baby in the first 4-6 weeks after your baby is born. After this time, you may choose to use a pacifier. Research has shown that pacifier use during the first year of a baby's life decreases the risk of sudden infant death syndrome (SIDS). Allow your baby to feed on each breast as long as he or she wants. When your baby unlatches or falls asleep while feeding from the first breast, offer the second breast. Because newborns are often sleepy in the first few weeks of life, you may need to awaken your baby to get him or her to feed. Breastfeeding times will vary from baby to baby. However, the following rules can serve as a guide to help you make sure that your baby is properly  fed:  Newborns (babies 41 weeks of age or younger) may breastfeed every 1-3 hours.  Newborns should not go without breastfeeding for longer than 3 hours during the day or 5 hours during the night.  You should breastfeed your baby a minimum of 8 times in a 24-hour period. Breast milk pumping     Pumping and storing breast milk allows you to make sure that your baby is exclusively fed your breast milk, even at times when you are unable to breastfeed. This is especially important if you go back to work while you are still breastfeeding, or if you are not able to be present during feedings. Your lactation consultant can help you find a method of pumping that works best for you and give you guidelines about how long it is safe to store breast milk. Caring for your breasts while you breastfeed Nipples can become dry, cracked, and sore while breastfeeding. The following recommendations can help keep your breasts moisturized and healthy:  Avoid using soap on your nipples.  Wear a supportive bra designed especially for nursing. Avoid wearing underwire-style bras or extremely tight bras (sports bras).  Air-dry your nipples for 3-4 minutes after each feeding.  Use only cotton bra pads to absorb leaked breast milk. Leaking of breast milk between feedings is normal.  Use lanolin on your nipples after breastfeeding. Lanolin helps to maintain your skin's normal moisture barrier. Pure lanolin is not harmful (not toxic) to your baby. You may also hand express a few drops of breast milk and gently massage that milk into your nipples and allow the milk to air-dry. In the first few weeks after giving birth, some women experience breast engorgement. Engorgement can make your breasts feel heavy, warm, and tender to the touch. Engorgement peaks within 3-5 days after you give birth. The following recommendations can help to ease engorgement:  Completely empty your breasts while breastfeeding or pumping. You may  want to start by applying warm, moist heat (in the shower or with warm, water-soaked hand towels) just before feeding or pumping. This increases circulation and helps the milk flow. If your baby does not completely empty your breasts while breastfeeding, pump any extra milk after he or she is finished.  Apply ice packs to your breasts immediately after breastfeeding or pumping, unless this is too uncomfortable for you. To do this: ? Put ice in a plastic bag. ? Place a towel between your skin and the bag. ? Leave the ice on for 20 minutes, 2-3 times a day.  Make sure that your baby is latched on and positioned properly while breastfeeding. If  engorgement persists after 48 hours of following these recommendations, contact your health care provider or a Science writer. Overall health care recommendations while breastfeeding  Eat 3 healthy meals and 3 snacks every day. Well-nourished mothers who are breastfeeding need an additional 450-500 calories a day. You can meet this requirement by increasing the amount of a balanced diet that you eat.  Drink enough water to keep your urine pale yellow or clear.  Rest often, relax, and continue to take your prenatal vitamins to prevent fatigue, stress, and low vitamin and mineral levels in your body (nutrient deficiencies).  Do not use any products that contain nicotine or tobacco, such as cigarettes and e-cigarettes. Your baby may be harmed by chemicals from cigarettes that pass into breast milk and exposure to secondhand smoke. If you need help quitting, ask your health care provider.  Avoid alcohol.  Do not use illegal drugs or marijuana.  Talk with your health care provider before taking any medicines. These include over-the-counter and prescription medicines as well as vitamins and herbal supplements. Some medicines that may be harmful to your baby can pass through breast milk.  It is possible to become pregnant while breastfeeding. If birth  control is desired, ask your health care provider about options that will be safe while breastfeeding your baby. Where to find more information: [employer]: www.llli.org Contact a health care provider if:  You feel like you want to stop breastfeeding or have become frustrated with breastfeeding.  Your nipples are cracked or bleeding.  Your breasts are red, tender, or warm.  You have: ? Painful breasts or nipples. ? A swollen area on either breast. ? A fever or chills. ? Nausea or vomiting. ? Drainage other than breast milk from your nipples.  Your breasts do not become full before feedings by the fifth day after you give birth.  You feel sad and depressed.  Your baby is: ? Too sleepy to eat well. ? Having trouble sleeping. ? More than 44 week old and wetting fewer than 6 diapers in a 24-hour period. ? Not gaining weight by 2 days of age.  Your baby has fewer than 3 stools in a 24-hour period.  Your baby's skin or the white parts of his or her eyes become yellow. Get help right away if:  Your baby is overly tired (lethargic) and does not want to wake up and feed.  Your baby develops an unexplained fever. Summary  Breastfeeding offers many health benefits for infant and mothers.  Try to breastfeed your infant when he or she shows early signs of hunger.  Gently tickle or stroke your baby's lips with your finger or nipple to allow the baby to open his or her mouth. Bring the baby to your breast. Make sure that much of the areola is in your baby's mouth. Offer one side and burp the baby before you offer the other side.  Talk with your health care provider or lactation consultant if you have questions or you face problems as you breastfeed. This information is not intended to replace advice given to you by your health care provider. Make sure you discuss any questions you have with your health care provider. Document Revised: 06/12/2017 Document Reviewed:  04/19/2016 Elsevier Patient Education  [place]. Common Medications Safe in Pregnancy  Acne:      Constipation:  Benzoyl Peroxide     Colace  Clindamycin      Dulcolax Suppository  Topica Erythromycin  Fibercon  Salicylic Acid      Metamucil         Miralax AVOID:        Senakot   Accutane    Cough:  Retin-A       Cough Drops  Tetracycline      Phenergan w/ Codeine if Rx  Minocycline      Robitussin (Plain & DM)  Antibiotics:     Crabs/Lice:  Ceclor       RID  Cephalosporins    AVOID:  E-Mycins      Kwell  Keflex  Macrobid/Macrodantin   Diarrhea:  Penicillin      Kao-Pectate  Zithromax      Imodium AD         PUSH FLUIDS AVOID:       Cipro     Fever:  Tetracycline      Tylenol (Regular or Extra  Minocycline       Strength)  Levaquin      Extra Strength-Do not          Exceed 8 tabs/24 hrs Caffeine:        '200mg'$ /day (equiv. To 1 cup of coffee or  approx. 3 12 oz sodas)         Gas: Cold/Hayfever:       Gas-X  Benadryl      Mylicon  Claritin       Phazyme  **Claritin-D        Chlor-Trimeton    Headaches:  Dimetapp      ASA-Free Excedrin  Drixoral-Non-Drowsy     Cold Compress  Mucinex (Guaifenasin)     Tylenol (Regular or Extra  Sudafed/Sudafed-12 Hour     Strength)  **Sudafed PE Pseudoephedrine   Tylenol Cold & Sinus     Vicks Vapor Rub  Zyrtec  **AVOID if Problems With Blood Pressure         Heartburn: Avoid lying down for at least 1 hour after meals  Aciphex      Maalox     Rash:  Milk of Magnesia     Benadryl    Mylanta       1% Hydrocortisone Cream  Pepcid  Pepcid Complete   Sleep Aids:  Prevacid      Ambien   Prilosec       Benadryl  Rolaids       Chamomile Tea  Tums (Limit 4/day)     Unisom         Tylenol PM         Warm milk-add vanilla or  Hemorrhoids:       Sugar for taste  Anusol/Anusol H.C.  (RX: Analapram 2.5%)  Sugar Substitutes:  Hydrocortisone OTC     Ok in moderation  Preparation H      Tucks        Vaseline  lotion applied to tissue with wiping    Herpes:     Throat:  Acyclovir      Oragel  Famvir  Valtrex     Vaccines:         Flu Shot Leg Cramps:       *Gardasil  Benadryl      Hepatitis A         Hepatitis B Nasal Spray:       Pneumovax  Saline Nasal Spray     Polio Booster         Tetanus Nausea:       Tuberculosis test or PPD  Dramamine      *Gardasil  Emetrol       Live Poliovirus  Ginger Root 250 mg QID    MMR (measles, mumps &  High Complex Carbs @ Bedtime    rebella)  Sea Bands-Accupressure    Varicella (Chickenpox)  Unisom 1/2 tab TID     *No known complications           If received before Pain:         Known pregnancy;   Darvocet       Resume series after  Lortab        Delivery  Percocet    Yeast:   Tramadol      Femstat  Tylenol 3      Gyne-lotrimin  Ultram       Monistat  Vicodin           MISC:         All Sunscreens           Hair Coloring/highlights          Insect Repellant's          (Including DEET)         Mystic Tans  

## 2019-11-12 NOTE — Progress Notes (Signed)
ROB-Pt present for routine prenatal care. Pt c/o of abd pain when the baby moves. Pt stated that it feels like the baby is trying to come through her skin.

## 2019-11-12 NOTE — Progress Notes (Signed)
ROB: Patient is doing well overall, [redacted]w[redacted]d. She complains of a little abdominal pain as the baby moves and "feels like she is poking through my skin". She has insomnia and has been unable to sleep well at night. She continues to not have an appetite throughout the day.

## 2019-11-12 NOTE — Progress Notes (Signed)
ROB: Patient complains of difficulty sleeping, notes her twins are going through sleep regression (waking in the middle of the night for 1-2 hours and are very active). Still with poor weight gain in pregnancy, has continued decreased appetite, will get growth scan. If normal, no further evaluation needed. Notes some abdominal pain with fetal movement.

## 2019-11-13 LAB — VITAMIN D 25 HYDROXY (VIT D DEFICIENCY, FRACTURES): Vit D, 25-Hydroxy: 8.3 ng/mL — ABNORMAL LOW (ref 30.0–100.0)

## 2019-11-15 ENCOUNTER — Encounter: Payer: Self-pay | Admitting: Obstetrics and Gynecology

## 2019-11-15 DIAGNOSIS — E559 Vitamin D deficiency, unspecified: Secondary | ICD-10-CM | POA: Insufficient documentation

## 2019-11-16 ENCOUNTER — Other Ambulatory Visit: Payer: Self-pay

## 2019-11-16 ENCOUNTER — Ambulatory Visit (INDEPENDENT_AMBULATORY_CARE_PROVIDER_SITE_OTHER): Payer: BC Managed Care – PPO

## 2019-11-16 DIAGNOSIS — Z3A31 31 weeks gestation of pregnancy: Secondary | ICD-10-CM

## 2019-11-16 DIAGNOSIS — O0993 Supervision of high risk pregnancy, unspecified, third trimester: Secondary | ICD-10-CM | POA: Diagnosis not present

## 2019-11-16 DIAGNOSIS — O2613 Low weight gain in pregnancy, third trimester: Secondary | ICD-10-CM | POA: Diagnosis not present

## 2019-11-19 ENCOUNTER — Other Ambulatory Visit: Payer: Self-pay

## 2019-11-19 MED ORDER — HYDROXYZINE PAMOATE 25 MG PO CAPS: 25.0000 mg | ORAL_CAPSULE | Freq: Every day | ORAL | 1 refills | Status: DC

## 2019-11-25 ENCOUNTER — Encounter: Payer: Self-pay | Admitting: Obstetrics and Gynecology

## 2019-11-25 ENCOUNTER — Telehealth: Payer: Self-pay | Admitting: Surgical

## 2019-11-25 ENCOUNTER — Encounter: Payer: BC Managed Care – PPO | Admitting: Obstetrics and Gynecology

## 2019-11-25 NOTE — Telephone Encounter (Signed)
LM on patients voicemail that she had missed her appointment this AM and I was calling to check on her. Left phone number for her to call back to reschedule.

## 2019-11-30 ENCOUNTER — Ambulatory Visit (INDEPENDENT_AMBULATORY_CARE_PROVIDER_SITE_OTHER): Payer: BC Managed Care – PPO | Admitting: Obstetrics and Gynecology

## 2019-11-30 ENCOUNTER — Other Ambulatory Visit: Payer: Self-pay

## 2019-11-30 ENCOUNTER — Encounter: Payer: Self-pay | Admitting: Obstetrics and Gynecology

## 2019-11-30 VITALS — BP 128/83 | HR 105 | Wt 186.9 lb

## 2019-11-30 DIAGNOSIS — O0993 Supervision of high risk pregnancy, unspecified, third trimester: Secondary | ICD-10-CM

## 2019-11-30 DIAGNOSIS — Z3A34 34 weeks gestation of pregnancy: Secondary | ICD-10-CM

## 2019-11-30 NOTE — Progress Notes (Signed)
ROB: Patient generally uncomfortable with back pain and occasional abdominal pain was tightenings.  She says that she remembers normal labor contractions and that is not what is happening.  Not sleeping well because of her discomfort as well as her twins being up part of the night.  We discussed use of belly band during the day and she says she has tried this before and it did not work.  Discussed ultrasound showing 37th percentile for growth.  Cultures next visit.

## 2019-12-08 ENCOUNTER — Other Ambulatory Visit: Payer: Self-pay

## 2019-12-08 ENCOUNTER — Ambulatory Visit (INDEPENDENT_AMBULATORY_CARE_PROVIDER_SITE_OTHER): Payer: BC Managed Care – PPO | Admitting: Obstetrics and Gynecology

## 2019-12-08 VITALS — BP 113/71 | HR 102 | Wt 190.0 lb

## 2019-12-08 DIAGNOSIS — Z3A36 36 weeks gestation of pregnancy: Secondary | ICD-10-CM

## 2019-12-08 LAB — POCT URINALYSIS DIPSTICK OB
Bilirubin, UA: NEGATIVE
Blood, UA: NEGATIVE
Glucose, UA: NEGATIVE
Ketones, UA: NEGATIVE
Leukocytes, UA: NEGATIVE
Nitrite, UA: NEGATIVE
POC,PROTEIN,UA: NEGATIVE
Spec Grav, UA: 1.015 (ref 1.010–1.025)
Urobilinogen, UA: 0.2 E.U./dL
pH, UA: 5 (ref 5.0–8.0)

## 2019-12-08 NOTE — Progress Notes (Signed)
ROB: Patient having more contractions but still very irregular.  Discussed fetal growth and I think growth is good at this point.  GC/CT-GBS performed.

## 2019-12-09 ENCOUNTER — Encounter: Payer: BC Managed Care – PPO | Admitting: Obstetrics and Gynecology

## 2019-12-10 LAB — STREP GP B NAA: Strep Gp B NAA: NEGATIVE

## 2019-12-11 ENCOUNTER — Other Ambulatory Visit: Payer: Self-pay | Admitting: Obstetrics and Gynecology

## 2019-12-12 LAB — GC/CHLAMYDIA PROBE AMP
Chlamydia trachomatis, NAA: NEGATIVE
Neisseria Gonorrhoeae by PCR: NEGATIVE

## 2019-12-16 ENCOUNTER — Other Ambulatory Visit: Payer: Self-pay

## 2019-12-16 ENCOUNTER — Ambulatory Visit (INDEPENDENT_AMBULATORY_CARE_PROVIDER_SITE_OTHER): Payer: BC Managed Care – PPO | Admitting: Obstetrics and Gynecology

## 2019-12-16 ENCOUNTER — Encounter: Payer: Self-pay | Admitting: Obstetrics and Gynecology

## 2019-12-16 VITALS — BP 122/77 | HR 92 | Wt 190.7 lb

## 2019-12-16 DIAGNOSIS — O0993 Supervision of high risk pregnancy, unspecified, third trimester: Secondary | ICD-10-CM

## 2019-12-16 DIAGNOSIS — Z8759 Personal history of other complications of pregnancy, childbirth and the puerperium: Secondary | ICD-10-CM

## 2019-12-16 DIAGNOSIS — Z3A37 37 weeks gestation of pregnancy: Secondary | ICD-10-CM | POA: Diagnosis not present

## 2019-12-16 DIAGNOSIS — O34219 Maternal care for unspecified type scar from previous cesarean delivery: Secondary | ICD-10-CM

## 2019-12-16 DIAGNOSIS — O09893 Supervision of other high risk pregnancies, third trimester: Secondary | ICD-10-CM

## 2019-12-16 DIAGNOSIS — O09299 Supervision of pregnancy with other poor reproductive or obstetric history, unspecified trimester: Secondary | ICD-10-CM

## 2019-12-16 LAB — POCT URINALYSIS DIPSTICK OB
Bilirubin, UA: NEGATIVE
Blood, UA: NEGATIVE
Glucose, UA: NEGATIVE
Ketones, UA: NEGATIVE
Leukocytes, UA: NEGATIVE
Nitrite, UA: NEGATIVE
Spec Grav, UA: >=1.03 — AB (ref 1.010–1.025)
Urobilinogen, UA: 0.2 E.U./dL
pH, UA: 6 (ref 5.0–8.0)

## 2019-12-16 NOTE — Progress Notes (Signed)
ROB-Pt present for routine prenatal care. Pt c/o of vaginal pressure and braxton hick contraction.

## 2019-12-16 NOTE — Progress Notes (Signed)
ROB: feeling pelvic pressure and Braxton Hicks. 36 week cultures negative. Discussed labor precautions. RTC in 1 week.

## 2019-12-22 ENCOUNTER — Ambulatory Visit (INDEPENDENT_AMBULATORY_CARE_PROVIDER_SITE_OTHER): Payer: BC Managed Care – PPO | Admitting: Obstetrics and Gynecology

## 2019-12-22 ENCOUNTER — Other Ambulatory Visit: Payer: Self-pay

## 2019-12-22 VITALS — BP 130/84 | HR 84 | Wt 191.8 lb

## 2019-12-22 DIAGNOSIS — Z3A38 38 weeks gestation of pregnancy: Secondary | ICD-10-CM

## 2019-12-22 DIAGNOSIS — O0993 Supervision of high risk pregnancy, unspecified, third trimester: Secondary | ICD-10-CM

## 2019-12-22 LAB — POCT URINALYSIS DIPSTICK OB
Bilirubin, UA: NEGATIVE
Blood, UA: NEGATIVE
Glucose, UA: NEGATIVE
Ketones, UA: NEGATIVE
Leukocytes, UA: NEGATIVE
Nitrite, UA: NEGATIVE
POC,PROTEIN,UA: NEGATIVE
Spec Grav, UA: 1.01 (ref 1.010–1.025)
Urobilinogen, UA: 0.2 E.U./dL
pH, UA: 7 (ref 5.0–8.0)

## 2019-12-22 NOTE — Progress Notes (Signed)
ROB: Patient doing well.  States that she has contractions every day but not regular.  Begin NSTs next week (IVF pregnancy).  TOLAC discussed.  Signs and symptoms of labor reviewed

## 2019-12-23 NOTE — Telephone Encounter (Signed)
Pt called in and stated that she is requesting a call back. The pt has questions about her birthing plan. That pt want to talk about a possible c-seation. The pt was requesting to talk to the provider. I told her I will send a messsage to the nurse and someone will be in touch with her. The pt verbally understood. Please advise

## 2019-12-24 ENCOUNTER — Ambulatory Visit (INDEPENDENT_AMBULATORY_CARE_PROVIDER_SITE_OTHER): Payer: BC Managed Care – PPO | Admitting: Obstetrics and Gynecology

## 2019-12-24 ENCOUNTER — Other Ambulatory Visit: Payer: Self-pay

## 2019-12-24 ENCOUNTER — Encounter: Payer: Self-pay | Admitting: Obstetrics and Gynecology

## 2019-12-24 VITALS — BP 128/80 | HR 97 | Wt 192.6 lb

## 2019-12-24 DIAGNOSIS — Z01818 Encounter for other preprocedural examination: Secondary | ICD-10-CM

## 2019-12-24 DIAGNOSIS — O0993 Supervision of high risk pregnancy, unspecified, third trimester: Secondary | ICD-10-CM

## 2019-12-24 DIAGNOSIS — O34219 Maternal care for unspecified type scar from previous cesarean delivery: Secondary | ICD-10-CM

## 2019-12-24 NOTE — Progress Notes (Signed)
HPI:      [name] is a 24 y.o. [phone] who LMP was Patient's last menstrual period was 03/31/2019 (approximate).  Subjective:   She presents today because she says that she has chosen cesarean delivery of her vaginal birth.  She called the office yesterday stating this is her choice and she would like to schedule it as soon as possible.  She says she is "over being pregnant".  She is tired of having pain.  She also has concerns about vaginal birth thinking that she may get partway through labor and have to have a cesarean anyway.  She has also expressed concern that her mother will be unavailable to help postpartum if she goes past her due date.  We discussed cesarean versus vaginal birth.  We talked about induction for "social reasons" after 39 weeks but patient says she has read about induction and she "definitely does not want that".  She says she wants to be able to begin planning and know exactly when she is going to deliver. We discussed the possibility of vaginal birth if she goes into labor before her scheduled C-section and she has declined this as well.  She says that she is now set on cesarean and does not want a vaginal birth.    Hx: The following portions of the patient's history were reviewed and updated as appropriate:             She  has no past medical history on file. She does not have any pertinent problems on file. She  has a past surgical history that includes Cesarean section and Pilonidal cyst excision. Her family history includes Diabetes in her father; Healthy in her mother; Hypertension in her father. She  reports that she has never smoked. She has never used smokeless tobacco. She reports previous alcohol use. She reports that she does not use drugs. She has a current medication list which includes the following prescription(s): albuterol, ferralet 90, hydroxyzine, mometasone, multivitamin-prenatal, and vitamin d (ergocalciferol). She is allergic to grapefruit  extract and other.       Review of Systems:  Review of Systems  Constitutional: Denied constitutional symptoms, night sweats, recent illness, fatigue, fever, insomnia and weight loss.  Eyes: Denied eye symptoms, eye pain, photophobia, vision change and visual disturbance.  Ears/Nose/Throat/Neck: Denied ear, nose, throat or neck symptoms, hearing loss, nasal discharge, sinus congestion and sore throat.  Cardiovascular: Denied cardiovascular symptoms, arrhythmia, chest pain/pressure, edema, exercise intolerance, orthopnea and palpitations.  Respiratory: Denied pulmonary symptoms, asthma, pleuritic pain, productive sputum, cough, dyspnea and wheezing.  Gastrointestinal: Denied, gastro-esophageal reflux, melena, nausea and vomiting.  Genitourinary: Denied genitourinary symptoms including symptomatic vaginal discharge, pelvic relaxation issues, and urinary complaints.  Musculoskeletal: Denied musculoskeletal symptoms, stiffness, swelling, muscle weakness and myalgia.  Dermatologic: Denied dermatology symptoms, rash and scar.  Neurologic: Denied neurology symptoms, dizziness, headache, neck pain and syncope.  Psychiatric: Denied psychiatric symptoms, anxiety and depression.  Endocrine: Denied endocrine symptoms including hot flashes and night sweats.   Meds:   Current Outpatient Medications on File Prior to Visit  Medication Sig Dispense Refill  . albuterol (VENTOLIN HFA) 108 (90 Base) MCG/ACT inhaler albuterol sulfate HFA 90 mcg/actuation aerosol inhaler  INHALE 2 INHALATIONS INTO THE LUNGS EVERY 4 HOURS AS NEEDED FOR WHEEZING OR SHORTNESS OF BREATH    . Fe Cbn-Fe Gluc-FA-B12-C-DSS (FERRALET 90) 90-1 MG TABS Take 1 tablet by mouth daily. 30 tablet 3  . hydrOXYzine (VISTARIL) 25 MG capsule TAKE 1 CAPSULE (25 MG  TOTAL) BY MOUTH AT BEDTIME. 90 capsule 1  . mometasone (ELOCON) 0.1 % cream APPLY TOPICALLY 2 TIMES A DAY AS NEEDED FOR RASH    . Prenatal Vit-Fe Fumarate-FA (MULTIVITAMIN-PRENATAL)  27-0.8 MG TABS tablet Take 1 tablet by mouth daily at 12 noon.    . Vitamin D, Ergocalciferol, (DRISDOL) 1.25 MG (50000 UNIT) CAPS capsule Take 1 capsule (50,000 Units total) by mouth every 7 (seven) days. 12 capsule 0   No current facility-administered medications on file prior to visit.    Objective:     Vitals:   12/24/19 0742  BP: 128/80  Pulse: 97                Assessment:    G2P0102 Patient Active Problem List   Diagnosis Date Noted  . History of vitamin D deficiency 11/12/2019  . Back pain affecting pregnancy in third trimester 10/18/2019  . Desires VBAC (vaginal birth after cesarean) trial 09/28/2019  . Newborn product of in vitro fertilization (IVF) pregnancy 09/28/2019  . History of twin pregnancy in prior pregnancy 09/28/2019  . History of HELLP syndrome, currently pregnant 08/03/2019  . Pregnancy resulting from assisted reproductive technology in second trimester 08/03/2019  . History of maternal blood transfusion, currently pregnant 08/03/2019  . History of cesarean section complicating pregnancy 08/03/2019  . Supervision of high risk pregnancy, antepartum, third trimester 08/03/2019  . History of preterm delivery, currently pregnant in third trimester 08/03/2019     1. Preop examination   2. Supervision of high risk pregnancy in third trimester   3. History of cesarean section complicating pregnancy     Patient has now decided definitively that she wants cesarean delivery of a vaginal birth.   Plan:            1.  Risk benefits of cesarean delivery discussed in detail.  All questions answered.  Rationale for cesarean delivery versus vaginal discussed.  Patient declined induction.    2.  We will schedule repeat cesarean delivery for 39 weeks.  Orders No orders of the defined types were placed in this encounter.   No orders of the defined types were placed in this encounter.     F/U  No follow-ups on file. I spent 30 minutes involved in the care of  this patient preparing to see the patient by obtaining and reviewing her medical history (including labs, imaging tests and prior procedures), documenting clinical information in the electronic health record (EHR), counseling and coordinating care plans, writing and sending prescriptions, ordering tests or procedures and directly communicating with the patient by discussing pertinent items from her history and physical exam as well as detailing my assessment and plan as noted above so that she has an informed understanding.  All of her questions were answered.  Elonda Husky, M.D. 12/24/2019 8:12 AM

## 2019-12-24 NOTE — Addendum Note (Signed)
Addended by: Elonda Husky on: 12/24/2019 08:21 AM   Modules accepted: Orders, SmartSet

## 2019-12-27 ENCOUNTER — Encounter
Admission: RE | Admit: 2019-12-27 | Discharge: 2019-12-27 | Disposition: A | Discharge status: Home / Self Care | Payer: BC Managed Care – PPO | Source: Ambulatory Visit | Attending: Obstetrics and Gynecology | Admitting: Obstetrics and Gynecology

## 2019-12-27 ENCOUNTER — Other Ambulatory Visit
Admission: RE | Admit: 2019-12-27 | Discharge: 2019-12-27 | Disposition: A | Discharge status: Home / Self Care | Payer: BC Managed Care – PPO | Source: Ambulatory Visit | Attending: Obstetrics and Gynecology | Admitting: Obstetrics and Gynecology

## 2019-12-27 ENCOUNTER — Other Ambulatory Visit: Payer: Self-pay

## 2019-12-27 DIAGNOSIS — Z20822 Contact with and (suspected) exposure to covid-19: Secondary | ICD-10-CM | POA: Insufficient documentation

## 2019-12-27 DIAGNOSIS — Z01812 Encounter for preprocedural laboratory examination: Secondary | ICD-10-CM | POA: Insufficient documentation

## 2019-12-27 HISTORY — DX: HELLP syndrome (HELLP), unspecified trimester: O14.20

## 2019-12-27 HISTORY — DX: Anemia, unspecified: D64.9

## 2019-12-27 HISTORY — DX: Unspecified asthma, uncomplicated: J45.909

## 2019-12-27 LAB — SARS CORONAVIRUS 2 (TAT 6-24 HRS): SARS Coronavirus 2: NEGATIVE

## 2019-12-27 NOTE — Patient Instructions (Signed)
Your procedure is scheduled on:12-29-19 WEDNESDAY Report to [facility] CALL [phone] FOR SOMEONE TO LET YOU IN-THEN PROCEED TO LABOR AND DELIVERY ON 3RD FLOOR-ARRIVE AT 5:30 AM  REMEMBER: Instructions that are not followed completely may result in serious medical risk, up to and including death; or upon the discretion of your surgeon and anesthesiologist your surgery may need to be rescheduled.  Do not eat food after midnight the night before surgery.  No gum chewing, lozengers or hard candies.  You may however, drink CLEAR liquids up to 2 hours before you are scheduled to arrive for your surgery. Do not drink anything within 2 hours of your scheduled arrival time.  Clear liquids include: - water  - apple juice without pulp - gatorade (not RED) - black coffee or tea (Do NOT add milk or creamers to the coffee or tea) Do NOT drink anything that is not on this list.  TAKE THESE MEDICATIONS THE MORNING OF SURGERY WITH A SIP OF WATER: -NONE  Use inhalers on the day of surgery and bring to the hospital-USE YOUR ALBUTEROL INHALER MORNING OF SURGERY AND BRING INHALER TO HOSPITAL   One week prior to surgery: Stop Anti-inflammatories (NSAIDS) such as Advil, Aleve, Ibuprofen, Motrin, Naproxen, Naprosyn and Aspirin based products such as Excedrin, Goodys Powder, BC Powder-OK TO TAKE TYLENOL  Stop ANY OVER THE COUNTER supplements until after surgery. (You may continue taking Tylenol, Vitamin D, Vitamin B, and multivitamin.)  No Alcohol for 24 hours before or after surgery.  No Smoking including e-cigarettes for 24 hours prior to surgery.  No chewable tobacco products for at least 6 hours prior to surgery.  No nicotine patches on the day of surgery.  Do not use any "recreational" drugs for at least a week prior to your surgery.  Please be advised that the combination of cocaine and anesthesia may have negative outcomes, up to and including death. If you test positive for cocaine,  your surgery will be cancelled.  On the morning of surgery brush your teeth with toothpaste and water, you may rinse your mouth with mouthwash if you wish. Do not swallow any toothpaste or mouthwash.  Do not wear jewelry, make-up, hairpins, clips or nail polish.  Do not wear lotions, powders, or perfumes.   Do not shave 48 hours prior to surgery.   Contact lenses, hearing aids and dentures may not be worn into surgery.  Do not bring valuables to the hospital. [facility] is not responsible for any missing/lost belongings or valuables.   Notify your doctor if there is any change in your medical condition (cold, fever, infection).  Wear comfortable clothing (specific to your surgery type) to the hospital.  Plan for stool softeners for home use; pain medications have a tendency to cause constipation. You can also help prevent constipation by eating foods high in fiber such as fruits and vegetables and drinking plenty of fluids as your diet allows.  After surgery, you can help prevent lung complications by doing breathing exercises.  Take deep breaths and cough every 1-2 hours. Your doctor may order a device called an Incentive Spirometer to help you take deep breaths. When coughing or sneezing, hold a pillow firmly against your incision with both hands. This is called "splinting." Doing this helps protect your incision. It also decreases belly discomfort.  If you are being admitted to the hospital overnight, leave your suitcase in the car. After surgery it may be brought to your room.  If you are being discharged  the day of surgery, you will not be allowed to drive home. You will need a responsible adult (18 years or older) to drive you home and stay with you that night.   If you are taking public transportation, you will need to have a responsible adult (18 years or older) with you. Please confirm with your physician that it is acceptable to use public transportation.   Please call  the Pre-admissions Testing Dept. at [phone] if you have any questions about these instructions.  Visitation Policy:  Patients undergoing a surgery or procedure may have one family member or support person with them as long as that person is not COVID-19 positive or experiencing its symptoms.  That person may remain in the waiting area during the procedure.  Inpatient Visitation Update:   In an effort to ensure the safety of our team members and our patients, we are implementing a change to our visitation policy:  Effective Monday, Aug. 9, at 7 a.m., inpatients will be allowed one support person.  o The support person may change daily.  o The support person must pass our screening, gel in and out, and wear a mask at all times, including in the patient's room.  o Patients must also wear a mask when staff or their support person are in the room.  o Masking is required regardless of vaccination status.  Systemwide, no visitors 17 or younger.

## 2019-12-28 ENCOUNTER — Other Ambulatory Visit: Payer: BC Managed Care – PPO

## 2019-12-28 ENCOUNTER — Encounter: Payer: BC Managed Care – PPO | Admitting: Obstetrics and Gynecology

## 2019-12-29 ENCOUNTER — Inpatient Hospital Stay: Payer: BC Managed Care – PPO | Admitting: Certified Registered"

## 2019-12-29 ENCOUNTER — Inpatient Hospital Stay
Admission: RE | Admit: 2019-12-29 | Discharge: 2019-12-31 | DRG: 788 | Disposition: A | Discharge status: Home / Self Care | Payer: BC Managed Care – PPO | Attending: Obstetrics and Gynecology | Admitting: Obstetrics and Gynecology

## 2019-12-29 ENCOUNTER — Encounter: Payer: Self-pay | Admitting: Obstetrics and Gynecology

## 2019-12-29 ENCOUNTER — Other Ambulatory Visit: Payer: Self-pay

## 2019-12-29 ENCOUNTER — Encounter
Admission: RE | Disposition: A | Discharge status: Home / Self Care | Payer: Self-pay | Source: Home / Self Care | Attending: Obstetrics and Gynecology

## 2019-12-29 DIAGNOSIS — O34219 Maternal care for unspecified type scar from previous cesarean delivery: Secondary | ICD-10-CM | POA: Diagnosis present

## 2019-12-29 DIAGNOSIS — Z8759 Personal history of other complications of pregnancy, childbirth and the puerperium: Secondary | ICD-10-CM

## 2019-12-29 DIAGNOSIS — Z3A39 39 weeks gestation of pregnancy: Secondary | ICD-10-CM | POA: Diagnosis not present

## 2019-12-29 DIAGNOSIS — O1424 HELLP syndrome, complicating childbirth: Secondary | ICD-10-CM | POA: Diagnosis not present

## 2019-12-29 DIAGNOSIS — Z98891 History of uterine scar from previous surgery: Secondary | ICD-10-CM

## 2019-12-29 DIAGNOSIS — Z20822 Contact with and (suspected) exposure to covid-19: Secondary | ICD-10-CM | POA: Diagnosis not present

## 2019-12-29 DIAGNOSIS — O34211 Maternal care for low transverse scar from previous cesarean delivery: Principal | ICD-10-CM | POA: Diagnosis present

## 2019-12-29 DIAGNOSIS — Z9889 Other specified postprocedural states: Secondary | ICD-10-CM

## 2019-12-29 LAB — CBC
HCT: 27.4 % — ABNORMAL LOW (ref 36.0–46.0)
Hemoglobin: 8.4 g/dL — ABNORMAL LOW (ref 12.0–15.0)
MCH: 23.4 pg — ABNORMAL LOW (ref 26.0–34.0)
MCHC: 30.7 g/dL (ref 30.0–36.0)
MCV: 76.3 fL — ABNORMAL LOW (ref 80.0–100.0)
Platelets: 296 10*3/uL (ref 150–400)
RBC: 3.59 MIL/uL — ABNORMAL LOW (ref 3.87–5.11)
RDW: 16.4 % — ABNORMAL HIGH (ref 11.5–15.5)
WBC: 6.4 10*3/uL (ref 4.0–10.5)
nRBC: 0 % (ref 0.0–0.2)

## 2019-12-29 LAB — TYPE AND SCREEN
ABO/RH(D): A POS
Antibody Screen: NEGATIVE

## 2019-12-29 SURGERY — Surgical Case
Anesthesia: Spinal

## 2019-12-29 MED ORDER — OXYCODONE HCL 5 MG PO TABS
5.0000 mg | ORAL_TABLET | Freq: Four times a day (QID) | ORAL | Status: AC | PRN
  Administered 2019-12-29 (×2): 5 mg via ORAL
  Filled 2019-12-29 (×2): qty 1

## 2019-12-29 MED ORDER — KETOROLAC TROMETHAMINE 30 MG/ML IJ SOLN: 30.0000 mg | Freq: Four times a day (QID) | INTRAMUSCULAR | Status: AC

## 2019-12-29 MED ORDER — NALBUPHINE HCL 10 MG/ML IJ SOLN: 5.0000 mg | INTRAMUSCULAR | Status: DC | PRN

## 2019-12-29 MED ORDER — CEFAZOLIN SODIUM-DEXTROSE 2-4 GM/100ML-% IV SOLN
2.0000 g | INTRAVENOUS | Status: AC
  Administered 2019-12-29: 2 g via INTRAVENOUS
  Filled 2019-12-29: qty 100

## 2019-12-29 MED ORDER — PHENYLEPHRINE HCL (PRESSORS) 10 MG/ML IV SOLN: INTRAVENOUS | Status: DC | PRN

## 2019-12-29 MED ORDER — PHENYLEPHRINE HCL (PRESSORS) 10 MG/ML IV SOLN
INTRAVENOUS | Status: AC
  Filled 2019-12-29: qty 1

## 2019-12-29 MED ORDER — OXYCODONE HCL 5 MG PO TABS: 5.0000 mg | ORAL_TABLET | Freq: Once | ORAL | Status: DC | PRN

## 2019-12-29 MED ORDER — FENTANYL CITRATE (PF) 100 MCG/2ML IJ SOLN
INTRAMUSCULAR | Status: DC | PRN
Start: 2019-12-29 — End: 2019-12-29
  Administered 2019-12-29: 15 ug via INTRATHECAL

## 2019-12-29 MED ORDER — OXYTOCIN-SODIUM CHLORIDE 30-0.9 UT/500ML-% IV SOLN
INTRAVENOUS | Status: DC | PRN
  Administered 2019-12-29: 250 mL/h via INTRAVENOUS

## 2019-12-29 MED ORDER — DIPHENHYDRAMINE HCL 25 MG PO CAPS: 25.0000 mg | ORAL_CAPSULE | ORAL | Status: DC | PRN

## 2019-12-29 MED ORDER — LACTATED RINGERS IV SOLN: INTRAVENOUS | Status: DC

## 2019-12-29 MED ORDER — FENTANYL CITRATE (PF) 100 MCG/2ML IJ SOLN
INTRAMUSCULAR | Status: AC
  Filled 2019-12-29: qty 2

## 2019-12-29 MED ORDER — LIDOCAINE 5 % EX PTCH
MEDICATED_PATCH | CUTANEOUS | Status: AC
  Filled 2019-12-29: qty 1

## 2019-12-29 MED ORDER — SENNOSIDES-DOCUSATE SODIUM 8.6-50 MG PO TABS
2.0000 | ORAL_TABLET | ORAL | Status: DC
  Administered 2019-12-29: 2 via ORAL
  Filled 2019-12-29 (×2): qty 2

## 2019-12-29 MED ORDER — SODIUM CHLORIDE 0.9% FLUSH: 3.0000 mL | INTRAVENOUS | Status: DC | PRN

## 2019-12-29 MED ORDER — SODIUM CHLORIDE 0.9 % IV SOLN
INTRAVENOUS | Status: DC | PRN
  Administered 2019-12-29: 50 ug/min via INTRAVENOUS

## 2019-12-29 MED ORDER — NALBUPHINE HCL 10 MG/ML IJ SOLN
5.0000 mg | Freq: Once | INTRAMUSCULAR | Status: AC | PRN
  Administered 2019-12-29: 5 mg via INTRAVENOUS
  Filled 2019-12-29: qty 1

## 2019-12-29 MED ORDER — ZOLPIDEM TARTRATE 5 MG PO TABS: 5.0000 mg | ORAL_TABLET | Freq: Every evening | ORAL | Status: DC | PRN

## 2019-12-29 MED ORDER — ONDANSETRON HCL 4 MG/2ML IJ SOLN: 4.0000 mg | Freq: Three times a day (TID) | INTRAMUSCULAR | Status: DC | PRN

## 2019-12-29 MED ORDER — KETOROLAC TROMETHAMINE 30 MG/ML IJ SOLN
30.0000 mg | Freq: Four times a day (QID) | INTRAMUSCULAR | Status: AC
  Administered 2019-12-29 – 2019-12-30 (×4): 30 mg via INTRAVENOUS
  Filled 2019-12-29 (×4): qty 1

## 2019-12-29 MED ORDER — LACTATED RINGERS IV SOLN: Freq: Once | INTRAVENOUS | Status: DC

## 2019-12-29 MED ORDER — DIPHENHYDRAMINE HCL 50 MG/ML IJ SOLN: 12.5000 mg | INTRAMUSCULAR | Status: DC | PRN

## 2019-12-29 MED ORDER — NALOXONE HCL 4 MG/10ML IJ SOLN
1.0000 ug/kg/h | INTRAVENOUS | Status: DC | PRN
  Filled 2019-12-29: qty 5

## 2019-12-29 MED ORDER — DIPHENHYDRAMINE HCL 25 MG PO CAPS
25.0000 mg | ORAL_CAPSULE | Freq: Four times a day (QID) | ORAL | Status: DC | PRN
  Administered 2019-12-29 – 2019-12-30 (×4): 25 mg via ORAL
  Filled 2019-12-29 (×4): qty 1

## 2019-12-29 MED ORDER — SOD CITRATE-CITRIC ACID 500-334 MG/5ML PO SOLN
ORAL | Status: AC
  Administered 2019-12-29: 30 mL
  Filled 2019-12-29: qty 30

## 2019-12-29 MED ORDER — ACETAMINOPHEN 325 MG PO TABS
650.0000 mg | ORAL_TABLET | Freq: Four times a day (QID) | ORAL | Status: AC
  Administered 2019-12-29 – 2019-12-30 (×4): 650 mg via ORAL
  Filled 2019-12-29 (×4): qty 2

## 2019-12-29 MED ORDER — MENTHOL 3 MG MT LOZG
1.0000 | LOZENGE | OROMUCOSAL | Status: DC | PRN
  Filled 2019-12-29: qty 9

## 2019-12-29 MED ORDER — FENTANYL CITRATE (PF) 100 MCG/2ML IJ SOLN: 25.0000 ug | INTRAMUSCULAR | Status: DC | PRN

## 2019-12-29 MED ORDER — OXYTOCIN-SODIUM CHLORIDE 30-0.9 UT/500ML-% IV SOLN
INTRAVENOUS | Status: AC
  Filled 2019-12-29: qty 500

## 2019-12-29 MED ORDER — ONDANSETRON HCL 4 MG/2ML IJ SOLN
INTRAMUSCULAR | Status: AC
  Filled 2019-12-29: qty 2

## 2019-12-29 MED ORDER — MEPERIDINE HCL 25 MG/ML IJ SOLN: 6.2500 mg | INTRAMUSCULAR | Status: DC | PRN

## 2019-12-29 MED ORDER — PRENATAL MULTIVITAMIN CH
1.0000 | ORAL_TABLET | Freq: Every day | ORAL | Status: DC
  Administered 2019-12-29 – 2019-12-30 (×2): 1 via ORAL
  Filled 2019-12-29 (×2): qty 1

## 2019-12-29 MED ORDER — SIMETHICONE 80 MG PO CHEW
80.0000 mg | CHEWABLE_TABLET | Freq: Four times a day (QID) | ORAL | Status: DC
  Administered 2019-12-29 – 2019-12-30 (×7): 80 mg via ORAL
  Filled 2019-12-29 (×7): qty 1

## 2019-12-29 MED ORDER — NALOXONE HCL 0.4 MG/ML IJ SOLN: 0.4000 mg | INTRAMUSCULAR | Status: DC | PRN

## 2019-12-29 MED ORDER — LACTATED RINGERS IV SOLN: Freq: Once | INTRAVENOUS | Status: AC

## 2019-12-29 MED ORDER — NALBUPHINE HCL 10 MG/ML IJ SOLN
5.0000 mg | INTRAMUSCULAR | Status: DC | PRN
  Administered 2019-12-29 – 2019-12-30 (×4): 5 mg via INTRAVENOUS
  Filled 2019-12-29 (×4): qty 1

## 2019-12-29 MED ORDER — IBUPROFEN 800 MG PO TABS
800.0000 mg | ORAL_TABLET | Freq: Three times a day (TID) | ORAL | Status: DC
  Administered 2019-12-30 – 2019-12-31 (×3): 800 mg via ORAL
  Filled 2019-12-29 (×3): qty 1

## 2019-12-29 MED ORDER — IBUPROFEN 800 MG PO TABS: 800.0000 mg | ORAL_TABLET | Freq: Three times a day (TID) | ORAL | Status: DC

## 2019-12-29 MED ORDER — BUPIVACAINE IN DEXTROSE 0.75-8.25 % IT SOLN
INTRATHECAL | Status: DC | PRN
  Administered 2019-12-29: 1.5 mL via INTRATHECAL

## 2019-12-29 MED ORDER — MORPHINE SULFATE (PF) 0.5 MG/ML IJ SOLN
INTRAMUSCULAR | Status: DC | PRN
Start: 2019-12-29 — End: 2019-12-29
  Administered 2019-12-29: 100 ug via INTRATHECAL

## 2019-12-29 MED ORDER — POVIDONE-IODINE 10 % EX SWAB: 2.0000 "application " | Freq: Once | CUTANEOUS | Status: DC

## 2019-12-29 MED ORDER — OXYCODONE-ACETAMINOPHEN 5-325 MG PO TABS
1.0000 | ORAL_TABLET | ORAL | Status: DC | PRN
  Administered 2019-12-30 – 2019-12-31 (×3): 1 via ORAL
  Filled 2019-12-29 (×3): qty 1

## 2019-12-29 MED ORDER — MORPHINE SULFATE (PF) 0.5 MG/ML IJ SOLN
INTRAMUSCULAR | Status: AC
  Filled 2019-12-29: qty 10

## 2019-12-29 MED ORDER — ONDANSETRON HCL 4 MG/2ML IJ SOLN
INTRAMUSCULAR | Status: DC | PRN
  Administered 2019-12-29: 4 mg via INTRAVENOUS

## 2019-12-29 MED ORDER — OXYTOCIN-SODIUM CHLORIDE 30-0.9 UT/500ML-% IV SOLN
2.5000 [IU]/h | INTRAVENOUS | Status: AC
  Administered 2019-12-29: 2.5 [IU]/h via INTRAVENOUS

## 2019-12-29 MED ORDER — NALBUPHINE HCL 10 MG/ML IJ SOLN: 5.0000 mg | Freq: Once | INTRAMUSCULAR | Status: AC | PRN

## 2019-12-29 MED ORDER — LIDOCAINE 5 % EX PTCH
MEDICATED_PATCH | CUTANEOUS | Status: DC | PRN
  Administered 2019-12-29: 1 via TRANSDERMAL

## 2019-12-29 MED ORDER — OXYCODONE HCL 5 MG/5ML PO SOLN
5.0000 mg | Freq: Once | ORAL | Status: DC | PRN
  Filled 2019-12-29: qty 5

## 2019-12-29 SURGICAL SUPPLY — 27 items
ADHESIVE MASTISOL STRL (MISCELLANEOUS) ×2 IMPLANT
BAG COUNTER SPONGE EZ (MISCELLANEOUS) ×2 IMPLANT
CANISTER SUCT 3000ML PPV (MISCELLANEOUS) ×2 IMPLANT
CHLORAPREP W/TINT 26 (MISCELLANEOUS) ×4 IMPLANT
CLOSURE STERI STRIP 1/2 X4 (GAUZE/BANDAGES/DRESSINGS) IMPLANT
COVER WAND RF STERILE (DRAPES) ×2 IMPLANT
DRSG TELFA 3X8 NADH (GAUZE/BANDAGES/DRESSINGS) ×2 IMPLANT
GAUZE SPONGE 4X4 12PLY STRL (GAUZE/BANDAGES/DRESSINGS) ×2 IMPLANT
GLOVE BIOGEL PI ORTHO PRO 7.5 (GLOVE) ×1
GLOVE PI ORTHO PRO STRL 7.5 (GLOVE) ×1 IMPLANT
GOWN STRL REUS W/ TWL LRG LVL3 (GOWN DISPOSABLE) ×2 IMPLANT
GOWN STRL REUS W/TWL LRG LVL3 (GOWN DISPOSABLE) ×2
KIT TURNOVER KIT A (KITS) ×2 IMPLANT
NS IRRIG 1000ML POUR BTL (IV SOLUTION) ×2 IMPLANT
PACK C SECTION AR (MISCELLANEOUS) ×2 IMPLANT
PAD OB MATERNITY 4.3X12.25 (PERSONAL CARE ITEMS) ×2 IMPLANT
PAD PREP 24X41 OB/GYN DISP (PERSONAL CARE ITEMS) ×2 IMPLANT
PENCIL SMOKE ULTRAEVAC 22 CON (MISCELLANEOUS) ×2 IMPLANT
RETRACTOR WND ALEXIS-O 25 LRG (MISCELLANEOUS) ×1 IMPLANT
RTRCTR WOUND ALEXIS O 25CM LRG (MISCELLANEOUS) ×2
SPONGE LAP 18X18 RF (DISPOSABLE) ×2 IMPLANT
STRIP CLOSURE SKIN 1/2X4 (GAUZE/BANDAGES/DRESSINGS) ×2 IMPLANT
SUT VIC AB 0 CTX 36 (SUTURE) ×2
SUT VIC AB 0 CTX36XBRD ANBCTRL (SUTURE) ×2 IMPLANT
SUT VIC AB 1 CT1 36 (SUTURE) ×6 IMPLANT
SUT VICRYL 3-0 36IN CTB-1 (SUTURE) ×2 IMPLANT
SUT VICRYL+ 3-0 36IN CT-1 (SUTURE) ×4 IMPLANT

## 2019-12-29 NOTE — Anesthesia Procedure Notes (Signed)
Spinal  Patient location during procedure: OR Start time: 12/29/2019 7:53 AM Staffing Performed: resident/CRNA  Preanesthetic Checklist Completed: patient identified, IV checked, site marked, risks and benefits discussed, surgical consent, monitors and equipment checked, pre-op evaluation and timeout performed Spinal Block Patient position: sitting Prep: DuraPrep Patient monitoring: heart rate, cardiac monitor, continuous pulse ox and blood pressure Approach: midline Location: L3-4 Injection technique: single-shot Needle Needle type: Sprotte  Needle gauge: 24 G Needle length: 9 cm Assessment Sensory level: T3 Additional Notes Negative pain with injection, negative heme, negative paresthesia.  Good free flow pre/post injection.

## 2019-12-29 NOTE — Lactation Note (Signed)
This note was copied from a baby's chart. Lactation Consultation Note  Patient Name: Sherry Lynn EAVWU'J Date: 12/29/2019 Reason for consult: Initial assessment;Term;Other (Comment) (c/s)  Initial lactation visit. Mom is G72P76 (25 month old twin girls at home born at 44 weeks) delivered via repeat c/s to full term baby Sherry. She exclusively pumped for twins, and stopped at 2 weeks due to low supply. LC assisted with feeding on MBU, baby latched easily to the breast, slight sandwich hold to demonstrate getting a deeper latch, mom has erect nipples, large breasts but baby does well. Strong rhythmic sucking pattern visible and audible swallows throughout feed when infant pushed away from the breast. Baby placed skin to skin on mom's chest where she was asleep immediately. LC reviewed BF basics: newborn feeding patterns and behaviors, early feeding cues, feeding on demand, stomach size, output expectations, breast massage and compression, offering of both breasts, position/alignment, milk supply and demand, and normal course of lactation. Encouraged to spend time skin to skin when feeling well, and to call out for BF support as needed. LC name/number updated on whiteboard.  Maternal Data Has patient been taught Hand Expression?: Yes Does the patient have breastfeeding experience prior to this delivery?: Yes  Feeding Feeding Type: Breast Fed  LATCH Score Latch: Grasps breast easily, tongue down, lips flanged, rhythmical sucking. (came off 1 time)  Audible Swallowing: Spontaneous and intermittent  Type of Nipple: Everted at rest and after stimulation  Comfort (Breast/Nipple): Soft / non-tender  Hold (Positioning): Assistance needed to correctly position infant at breast and maintain latch.  LATCH Score: 9  Interventions Interventions: Breast feeding basics reviewed;Assisted with latch;Hand express;Adjust position;Support pillows  Lactation Tools Discussed/Used     Consult  Status Consult Status: Follow-up Date: 12/29/19 Follow-up type: In-patient    Danford Bad 12/29/2019, 1:07 PM

## 2019-12-29 NOTE — Transfer of Care (Signed)
Immediate Anesthesia Transfer of Care Note  Patient: Sherry Lynn  Procedure(s) Performed: CESAREAN SECTION (N/A )  Patient Location: PACU and Mother/Baby  Anesthesia Type:Spinal  Level of Consciousness: awake  Airway & Oxygen Therapy: Patient Spontanous Breathing  Post-op Assessment: Report given to RN  Post vital signs: stable  Last Vitals:  Vitals Value Taken Time  BP    Temp    Pulse    Resp    SpO2      Last Pain:  Vitals:   12/29/19 0709  TempSrc:   PainSc: 0-No pain         Complications: No complications documented.

## 2019-12-29 NOTE — H&P (Signed)
History and Physical   HPI  Sherry Lynn is a 24 y.o. G2P0102 at [redacted]w[redacted]d Estimated Date of Delivery: 01/05/20 who is being admitted for C-section - desires repeat.  OB History  OB History  Gravida Para Term Preterm AB Living  2 1 0 1 0 2  SAB TAB Ectopic Multiple Live Births  0 0 0 1 2    # Outcome Date GA Lbr Len/2nd Weight Sex Delivery Anes PTL Lv  2 Current           1A Preterm 02/28/18 [redacted]w[redacted]d   F CS-LTranv Spinal  LIV     Complications: HELLP (hemolytic anemia/elev liver enzymes/low platelets in pregnancy), third trimester  1B Preterm 02/28/18    F CS-LTranv   LIV    PROBLEM LIST  Pregnancy complications or risks: Patient Active Problem List   Diagnosis Date Noted  . History of vitamin D deficiency 11/12/2019  . Back pain affecting pregnancy in third trimester 10/18/2019  . Desires VBAC (vaginal birth after cesarean) trial 09/28/2019  . Newborn product of in vitro fertilization (IVF) pregnancy 09/28/2019  . History of twin pregnancy in prior pregnancy 09/28/2019  . History of HELLP syndrome, currently pregnant 08/03/2019  . Pregnancy resulting from assisted reproductive technology in second trimester 08/03/2019  . History of maternal blood transfusion, currently pregnant 08/03/2019  . History of cesarean section complicating pregnancy 08/03/2019  . Supervision of high risk pregnancy, antepartum, third trimester 08/03/2019  . History of preterm delivery, currently pregnant in third trimester 08/03/2019    Prenatal labs and studies: ABO, Rh: --/--/A POS (09/29 1610) Antibody: NEG (09/29 0625) Rubella: 3.83 (03/29 1137) RPR: Non Reactive (07/22 1057)  HBsAg: Negative (03/29 1137)  HIV: Non Reactive (03/29 1137)  RUE:AVWUJWJX/-- (09/08 1416)   Past Medical History:  Diagnosis Date  . Anemia   . Asthma    well controlled  . HELLP syndrome      Past Surgical History:  Procedure Laterality Date  . CESAREAN SECTION    . PILONIDAL CYST EXCISION        Medications    Current Discharge Medication List    CONTINUE these medications which have NOT CHANGED   Details  albuterol (VENTOLIN HFA) 108 (90 Base) MCG/ACT inhaler Inhale 2 puffs into the lungs every 4 (four) hours as needed for wheezing.     hydrOXYzine (VISTARIL) 25 MG capsule TAKE 1 CAPSULE (25 MG TOTAL) BY MOUTH AT BEDTIME. Qty: 90 capsule, Refills: 1    mometasone (ELOCON) 0.1 % cream Apply 1 application topically daily as needed (Eczema).     Prenatal Vit-Fe Fumarate-FA (MULTIVITAMIN-PRENATAL) 27-0.8 MG TABS tablet Take 1 tablet by mouth daily at 12 noon.    Vitamin D, Ergocalciferol, (DRISDOL) 1.25 MG (50000 UNIT) CAPS capsule Take 1 capsule (50,000 Units total) by mouth every 7 (seven) days. Qty: 12 capsule, Refills: 0    Fe Cbn-Fe Gluc-FA-B12-C-DSS (FERRALET 90) 90-1 MG TABS Take 1 tablet by mouth daily. Qty: 30 tablet, Refills: 3         Allergies  Other and Grapefruit extract  Review of Systems  Pertinent items noted in HPI and remainder of comprehensive ROS otherwise negative.  Physical Exam  BP 127/81 (BP Location: Left Arm)   Pulse 89   Temp 98.8 F (37.1 C) (Oral)   Resp 18   Ht 5\' 1"  (1.549 m)   Wt 87.4 kg   LMP 03/31/2019 (Approximate)   BMI 36.39 kg/m   Lungs:  CTA B  Cardio: RRR without M/R/G Abd: Soft, gravid, NT Presentation: cephalic EXT: No C/C/ 1+ Edema DTRs: 2+ B CERVIX:    See Prenatal records for more detailed PE.     FHR:  Variability: Good {> 6 bpm)  Toco: Uterine Contractions: None  Test Results  Results for orders placed or performed during the hospital encounter of 12/29/19 (from the past 24 hour(s))  CBC     Status: Abnormal   Collection Time: 12/29/19  6:25 AM  Result Value Ref Range   WBC 6.4 4.0 - 10.5 K/uL   RBC 3.59 (L) 3.87 - 5.11 MIL/uL   Hemoglobin 8.4 (L) 12.0 - 15.0 g/dL   HCT 18.8 (L) 36 - 46 %   MCV 76.3 (L) 80.0 - 100.0 fL   MCH 23.4 (L) 26.0 - 34.0 pg   MCHC 30.7 30.0 - 36.0 g/dL    RDW 41.6 (H) 60.6 - 15.5 %   Platelets 296 150 - 400 K/uL   nRBC 0.0 0.0 - 0.2 %  Type and screen     Status: None   Collection Time: 12/29/19  6:25 AM  Result Value Ref Range   ABO/RH(D) A POS    Antibody Screen NEG    Sample Expiration      01/01/2020,2359 Performed at [facility], [address], [place], [place] [address]     Assessment   [phone] at [redacted]w[redacted]d Estimated Date of Delivery: 01/05/20  The fetus is reassuring.   Patient Active Problem List   Diagnosis Date Noted  . History of vitamin D deficiency 11/12/2019  . Back pain affecting pregnancy in third trimester 10/18/2019  . Desires VBAC (vaginal birth after cesarean) trial 09/28/2019  . Newborn product of in vitro fertilization (IVF) pregnancy 09/28/2019  . History of twin pregnancy in prior pregnancy 09/28/2019  . History of HELLP syndrome, currently pregnant 08/03/2019  . Pregnancy resulting from assisted reproductive technology in second trimester 08/03/2019  . History of maternal blood transfusion, currently pregnant 08/03/2019  . History of cesarean section complicating pregnancy 08/03/2019  . Supervision of high risk pregnancy, antepartum, third trimester 08/03/2019  . History of preterm delivery, currently pregnant in third trimester 08/03/2019    Plan  1. Admit to L&D :   2. EFM: -- Category 1 3. Admission labs 4. Cesarean delivery  Elonda Husky, M.D. 12/29/2019 7:43 AM

## 2019-12-29 NOTE — Anesthesia Preprocedure Evaluation (Signed)
Anesthesia Evaluation  Patient identified by MRN, date of birth, ID band Patient awake    Reviewed: Allergy & Precautions, H&P , NPO status , Patient's Chart, lab work & pertinent test results  History of Anesthesia Complications Negative for: history of anesthetic complications  Airway Mallampati: III  TM Distance: >3 FB Neck ROM: full    Dental  (+) Chipped   Pulmonary neg shortness of breath, asthma ,    Pulmonary exam normal        Cardiovascular hypertension, (-) angina(-) Past MI and (-) DOE negative cardio ROS Normal cardiovascular exam     Neuro/Psych    GI/Hepatic negative GI ROS, neg GERD  ,  Endo/Other    Renal/GU   negative genitourinary   Musculoskeletal   Abdominal   Peds  Hematology negative hematology ROS (+)   Anesthesia Other Findings Past Medical History: No date: Anemia No date: Asthma     Comment:  well controlled No date: HELLP syndrome  Past Surgical History: No date: CESAREAN SECTION No date: PILONIDAL CYST EXCISION  BMI    Body Mass Index: 36.39 kg/m      Reproductive/Obstetrics (+) Pregnancy                             Anesthesia Physical Anesthesia Plan  ASA: III  Anesthesia Plan: Spinal   Post-op Pain Management:    Induction:   PONV Risk Score and Plan:   Airway Management Planned: Natural Airway and Nasal Cannula  Additional Equipment:   Intra-op Plan:   Post-operative Plan:   Informed Consent: I have reviewed the patients History and Physical, chart, labs and discussed the procedure including the risks, benefits and alternatives for the proposed anesthesia with the patient or authorized representative who has indicated his/her understanding and acceptance.     Dental Advisory Given  Plan Discussed with: Anesthesiologist, CRNA and Surgeon  Anesthesia Plan Comments: (Patient reports no bleeding problems and no anticoagulant  use.  Plan for spinal with backup GA  Patient consented for risks of anesthesia including but not limited to:  - adverse reactions to medications - damage to eyes, teeth, lips or other oral mucosa - nerve damage due to positioning  - risk of bleeding, infection and or nerve damage from spinal that could lead to paralysis - risk of headache or failed spinal - damage to teeth, lips or other oral mucosa - sore throat or hoarseness - damage to heart, brain, nerves, lungs, other parts of body or loss of life  Patient voiced understanding.)        Anesthesia Quick Evaluation

## 2019-12-29 NOTE — Op Note (Signed)
      OP NOTE  Date: 12/29/2019   9:21 AM Name Sherry Lynn MR# [id number]  Preoperative Diagnosis: 1. Intrauterine pregnancy at [redacted]w[redacted]d Active Problems:   History of hemolysis, elevated liver enzymes, and low platelet (HELLP) syndrome   History of cesarean section   Declines VBAC (vaginal birth after cesarean) trial   Cesarean delivery delivered   [redacted] weeks gestation of pregnancy   Postoperative state  2.  Pt desires repeat  Postoperative Diagnosis: 1. Intrauterine pregnancy at [redacted]w[redacted]d, delivered 2. Viable infant 3. Remainder same as pre-op   Procedure: 1. Repeat Low-Transverse Cesarean Section  Surgeon: Elonda Husky, MD  Assistant:  Jeralyn Bennett CNM    No other capable assistant was available for this surgery which requires an experienced, high level assistant.  She provided exposure, dissection, suctioning, retraction, and general support and assistance during the procedure.   Anesthesia: Spinal    EBL: 240  ml     Findings: 1) female infant, Apgar scores of 9   at 1 minute and 9   at 5 minutes and a birthweight of 105.12  ounces.    2) Normal uterus, tubes and ovaries.    Procedure:  The patient was prepped and draped in the supine position and placed under spinal anesthesia.  A transverse incision was made across the abdomen in a Pfannenstiel manner. If indicated the old scar was systematically removed with sharp dissection.  We carried the dissection down to the level of the fascia.  The fascia was incised in a curvilinear manner.  The fascia was then elevated from the rectus muscles with blunt and sharp dissection.  The rectus muscles were separated laterally exposing the peritoneum.  The peritoneum was carefully entered with care being taken to avoid bowel and bladder.  A self-retaining retractor was placed.  The visceral peritoneum was incised in a curvilinear fashion across the lower uterine segment creating a bladder flap. A transverse incision was made across the  lower uterine segment and extended laterally and superiorly using the bandage scissors.  Artificial rupture membranes was performed and minimal clear  fluid was noted.  The infant was delivered from the cephalic OP position. After an appropriate time interval, the cord was doubly clamped and cut. Cord blood was obtained if required.  The infant was handed to the pediatric personnel  who then placed the infant under heat lamps where it was cleaned dried and suctioned as needed. The placenta was delivered. The hysterotomy incision was then identified on ring forceps.  The uterine cavity was cleaned with a moist lap sponge.  The hysterotomy incision was closed with a running interlocking suture of Vicryl.  Hemostasis was excellent.  Pitocin was run in the IV and the uterus was found to be firm. The posterior cul-de-sac and gutters were cleaned and inspected.  Hemostasis was noted.  The fascia was then closed with a running suture of #1 Vicryl.  Hemostasis of the subcutaneous tissues was obtained using the Bovie.  The subcutaneous tissues were closed with a running suture of 000 Vicryl.  A subcuticular suture was placed.  Steri-Strips were applied in the usual manner.  A pressure dressing was placed.  The patient went to the recovery room in stable condition.   Elonda Husky, M.D. 12/29/2019 9:21 AM

## 2019-12-30 ENCOUNTER — Encounter: Payer: Self-pay | Admitting: Obstetrics and Gynecology

## 2019-12-30 MED ORDER — ACETAMINOPHEN 325 MG PO TABS: 650.0000 mg | ORAL_TABLET | ORAL | Status: DC | PRN

## 2019-12-30 MED ORDER — DIPHENHYDRAMINE HCL 25 MG PO CAPS
25.0000 mg | ORAL_CAPSULE | ORAL | Status: DC | PRN
  Administered 2019-12-30 (×2): 25 mg via ORAL
  Filled 2019-12-30 (×3): qty 1

## 2019-12-30 MED ORDER — COCONUT OIL OIL
1.0000 "application " | TOPICAL_OIL | Status: DC | PRN
  Administered 2019-12-30: 1 via TOPICAL
  Filled 2019-12-30: qty 120

## 2019-12-30 NOTE — Progress Notes (Signed)
Progress Note - Cesarean Delivery  Sherry Lynn is a 24 y.o. [phone] now PP day 1 s/p C-Section, Low Transverse .   Subjective:  Patient reports no problems with eating, bowel movements, voiding, or their wound  She does complain of itching and has been using Nubain and Benadryl alternating.  Patient is attempting to breast-feed but states that the itching is limiting for breast-feeding.  Her pain is well controlled.  Objective:  Vital signs in last 24 hours: Temp:  [98.2 F (36.8 C)-98.6 F (37 C)] 98.4 F (36.9 C) (09/30 0803) Pulse Rate:  [74-107] 80 (09/30 0803) Resp:  [18-20] 18 (09/30 0803) BP: (119-130)/(76-84) 119/84 (09/30 0803) SpO2:  [94 %-100 %] 99 % (09/30 0803)  Physical Exam:  General: alert, cooperative and no distress Lochia: appropriate Uterine Fundus: firm Incision: healing well    Data Review Recent Labs    12/29/19 0625  HGB 8.4*  HCT 27.4*    Assessment:  Active Problems:   History of hemolysis, elevated liver enzymes, and low platelet (HELLP) syndrome   History of cesarean section   Declines VBAC (vaginal birth after cesarean) trial   Cesarean delivery delivered   [redacted] weeks gestation of pregnancy   Postoperative state   Status post Cesarean section. Doing well postoperatively.   Some degree of itching likely secondary to Duramorph.  Plan:       Continue current care.  Expect itching to resolve as Duramorph wears off.  Likely discharge tomorrow.  Breast-feeding encouraged  Elonda Husky, M.D. 12/30/2019 3:20 PM

## 2019-12-30 NOTE — Anesthesia Post-op Follow-up Note (Signed)
  Anesthesia Pain Follow-up Note  Patient: Sherry Lynn  Day #: 1  Date of Follow-up: 12/30/2019 Time: 8:46 AM  Last Vitals:  Vitals:   12/30/19 0700 12/30/19 0803  BP:  119/84  Pulse: 74 80  Resp:  18  Temp:  36.9 C  SpO2: 98% 99%    Level of Consciousness: alert  Pain: mild   Side Effects:None  Catheter Site Exam:clean, dry     Plan: D/C from anesthesia care at surgeon's request  [facility]

## 2019-12-30 NOTE — Anesthesia Postprocedure Evaluation (Signed)
Anesthesia Post Note  Patient: Sherry Lynn  Procedure(s) Performed: CESAREAN SECTION (N/A )  Patient location during evaluation: Mother Baby Anesthesia Type: Spinal Level of consciousness: awake, oriented and awake and alert Pain management: pain level controlled Vital Signs Assessment: post-procedure vital signs reviewed and stable Respiratory status: spontaneous breathing, respiratory function stable and nonlabored ventilation Cardiovascular status: stable Postop Assessment: no headache and no backache Anesthetic complications: no   No complications documented.   Last Vitals:  Vitals:   12/30/19 0700 12/30/19 0803  BP:  119/84  Pulse: 74 80  Resp:  18  Temp:  36.9 C  SpO2: 98% 99%    Last Pain:  Vitals:   12/30/19 0803  TempSrc: Oral  PainSc:                  Ginger Carne

## 2019-12-30 NOTE — Lactation Note (Signed)
This note was copied from a baby's chart. Lactation Consultation Note  Patient Name: Sherry Lynn [id number] Date: 12/30/2019 Reason for consult: Follow-up assessment;Term  Lactation follow-up. Day 2, mom moving around well, began pumping overnight to provide EBM via bottle. Mom feeling uncomfortable with having baby at the breast, and is more confident with expressing milk and giving bottle.  Mom has been consistent with pumping since set up by RN overnight, expressing 6-67mL of colostrum each time. Mom encouraged to give all EBM to baby, but mom is worried volumes are too small and requests formula if needed.  LC educated mom on how to offer breastmilk first, and look for satiety before offering supplement of formula. Discussed importance of consistency with pumping, and milk supply and demand. Mom is committed to pumping consistently.  LC to follow-up later this afternoon.  Maternal Data Formula Feeding for Exclusion: No Has patient been taught Hand Expression?: Yes Does the patient have breastfeeding experience prior to this delivery?: Yes  Feeding Feeding Type: Bottle Fed - Breast Milk Nipple Type: Slow - flow  LATCH Score                   Interventions Interventions: Breast feeding basics reviewed;DEBP  Lactation Tools Discussed/Used Pump Review: Setup, frequency, and cleaning;Milk Storage Date initiated:: 12/29/19 (during night shift)   Consult Status Consult Status: Follow-up Date: 12/30/19 Follow-up type: Call as needed    Danford Bad 12/30/2019, 11:39 AM

## 2019-12-31 ENCOUNTER — Encounter: Payer: BC Managed Care – PPO | Admitting: Obstetrics and Gynecology

## 2019-12-31 MED ORDER — OXYCODONE-ACETAMINOPHEN 5-325 MG PO TABS: 1.0000 | ORAL_TABLET | ORAL | 0 refills | Status: DC | PRN

## 2019-12-31 NOTE — Progress Notes (Signed)
Pt to be discharged with infant. Discharge instructions, prescriptions, and follow up appointments given to and reviewed with patient. Incision care kit given to and reviewed with patient. Pt verbalized understanding. To be escorted out by auxillary.

## 2019-12-31 NOTE — Discharge Instructions (Signed)
Postpartum Care After Cesarean Delivery This sheet gives you information about how to care for yourself from the time you deliver your baby to up to 6-12 weeks after delivery (postpartum period). Your health care provider may also give you more specific instructions. If you have problems or questions, contact your health care provider. Follow these instructions at home: Medicines  Take over-the-counter and prescription medicines only as told by your health care provider.  If you were prescribed an antibiotic medicine, take it as told by your health care provider. Do not stop taking the antibiotic even if you start to feel better.  Ask your health care provider if the medicine prescribed to you: ? Requires you to avoid driving or using heavy machinery. ? Can cause constipation. You may need to take actions to prevent or treat constipation, such as:  Drink enough fluid to keep your urine pale yellow.  Take over-the-counter or prescription medicines.  Eat foods that are high in fiber, such as beans, whole grains, and fresh fruits and vegetables.  Limit foods that are high in fat and processed sugars, such as fried or sweet foods. Activity  Gradually return to your normal activities as told by your health care provider.  Avoid activities that take a lot of effort and energy (are strenuous) until approved by your health care provider. Walking at a slow to moderate pace is usually safe. Ask your health care provider what activities are safe for you. ? Do not lift anything that is heavier than your baby or 10 lb (4.5 kg) as told by your health care provider. ? Do not vacuum, climb stairs, or drive a car for as long as told by your health care provider.  If possible, have someone help you at home until you are able to do your usual activities yourself.  Rest as much as possible. Try to rest or take naps while your baby is sleeping. Vaginal bleeding  It is normal to have vaginal bleeding  (lochia) after delivery. Wear a sanitary pad to absorb vaginal bleeding and discharge. ? During the first week after delivery, the amount and appearance of lochia is often similar to a menstrual period. ? Over the next few weeks, it will gradually decrease to a dry, yellow-brown discharge. ? For most women, lochia stops completely by 4-6 weeks after delivery. Vaginal bleeding can vary from woman to woman.  Change your sanitary pads frequently. Watch for any changes in your flow, such as: ? A sudden increase in volume. ? A change in color. ? Large blood clots.  If you pass a blood clot, save it and call your health care provider to discuss. Do not flush blood clots down the toilet before you get instructions from your health care provider.  Do not use tampons or douches until your health care provider says this is safe.  If you are not breastfeeding, your period should return 6-8 weeks after delivery. If you are breastfeeding, your period may return anytime between 8 weeks after delivery and the time that you stop breastfeeding. Perineal care   If your C-section (Cesarean section) was unplanned, and you were allowed to labor and push before delivery, you may have pain, swelling, and discomfort of the tissue between your vaginal opening and your anus (perineum). You may also have an incision in the tissue (episiotomy) or the tissue may have torn during delivery. Follow these instructions as told by your health care provider: ? Keep your perineum clean and dry as told by   your health care provider. Use medicated pads and pain-relieving sprays and creams as directed. ? If you have an episiotomy or vaginal tear, check the area every day for signs of infection. Check for:  Redness, swelling, or pain.  Fluid or blood.  Warmth.  Pus or a bad smell. ? You may be given a squirt bottle to use instead of wiping to clean the perineum area after you go to the bathroom. As you start healing, you may use  the squirt bottle before wiping yourself. Make sure to wipe gently. ? To relieve pain caused by an episiotomy, vaginal tear, or hemorrhoids, try taking a warm sitz bath 2-3 times a day. A sitz bath is a warm water bath that is taken while you are sitting down. The water should only come up to your hips and should cover your buttocks. Breast care  Within the first few days after delivery, your breasts may feel heavy, full, and uncomfortable (breast engorgement). You may also have milk leaking from your breasts. Your health care provider can suggest ways to help relieve breast discomfort. Breast engorgement should go away within a few days.  If you are breastfeeding: ? Wear a bra that supports your breasts and fits you well. ? Keep your nipples clean and dry. Apply creams and ointments as told by your health care provider. ? You may need to use breast pads to absorb milk leakage. ? You may have uterine contractions every time you breastfeed for several weeks after delivery. Uterine contractions help your uterus return to its normal size. ? If you have any problems with breastfeeding, work with your health care provider or a lactation consultant.  If you are not breastfeeding: ? Avoid touching your breasts as this can make your breasts produce more milk. ? Wear a well-fitting bra and use cold packs to help with swelling. ? Do not squeeze out (express) milk. This causes you to make more milk. Intimacy and sexuality  Ask your health care provider when you can engage in sexual activity. This may depend on your: ? Risk of infection. ? Healing rate. ? Comfort and desire to engage in sexual activity.  You are able to get pregnant after delivery, even if you have not had your period. If desired, talk with your health care provider about methods of family planning or birth control (contraception). Lifestyle  Do not use any products that contain nicotine or tobacco, such as cigarettes, e-cigarettes,  and chewing tobacco. If you need help quitting, ask your health care provider.  Do not drink alcohol, especially if you are breastfeeding. Eating and drinking   Drink enough fluid to keep your urine pale yellow.  Eat high-fiber foods every day. These may help prevent or relieve constipation. High-fiber foods include: ? Whole grain cereals and breads. ? Brown rice. ? Beans. ? Fresh fruits and vegetables.  Take your prenatal vitamins until your postpartum checkup or until your health care provider tells you it is okay to stop. General instructions  Keep all follow-up visits for you and your baby as told by your health care provider. Most women visit their health care provider for a postpartum checkup within the first 3-6 weeks after delivery. Contact a health care provider if you:  Feel unable to cope with the changes that a new baby brings to your life, and these feelings do not go away.  Feel unusually sad or worried.  Have breasts that are painful, hard, or turn red.  Have a fever.    Have trouble holding urine or keeping urine from leaking.  Have little or no interest in activities you used to enjoy.  Have not breastfed at all and you have not had a menstrual period for 12 weeks after delivery.  Have stopped breastfeeding and you have not had a menstrual period for 12 weeks after you stopped breastfeeding.  Have questions about caring for yourself or your baby.  Pass a blood clot from your vagina. Get help right away if you:  Have chest pain.  Have difficulty breathing.  Have sudden, severe leg pain.  Have severe pain or cramping in your abdomen.  Bleed from your vagina so much that you fill more than one sanitary pad in one hour. Bleeding should not be heavier than your heaviest period.  Develop a severe headache.  Faint.  Have blurred vision or spots in your vision.  Have a bad-smelling vaginal discharge.  Have thoughts about hurting yourself or your  baby. If you ever feel like you may hurt yourself or others, or have thoughts about taking your own life, get help right away. You can go to your nearest emergency department or call:  Your local emergency services (911 in the [place]  A suicide crisis helpline, such as the National Suicide Prevention Lifeline at 213-253-2861. This is open 24 hours a day. Summary  The period of time from when you deliver your baby to up to 6-12 weeks after delivery is called the postpartum period.  Gradually return to your normal activities as told by your health care provider.  Keep all follow-up visits for you and your baby as told by your health care provider. This information is not intended to replace advice given to you by your health care provider. Make sure you discuss any questions you have with your health care provider. Document Revised: 11/05/2017 Document Reviewed: 11/05/2017 Elsevier Patient Education  2020 [employer]. Postpartum Baby Blues The postpartum period begins right after the birth of a baby. During this time, there is often a lot of joy and excitement. It is also a time of many changes in the life of the parents. No matter how many times a mother gives birth, each child brings new challenges to the family, including different ways of relating to one another. It is common to have feelings of excitement along with confusing changes in moods, emotions, and thoughts. You may feel happy one minute and sad or stressed the next. These feelings of sadness usually happen in the period right after you have your baby, and they go away within a week or two. This is called the "baby blues." What are the causes? There is no known cause of baby blues. It is likely caused by a combination of factors. However, changes in hormone levels after childbirth are believed to trigger some of the symptoms. Other factors that can play a role in these mood changes include:  Lack of sleep.  Stressful life  events, such as poverty, caring for a loved one, or death of a loved one.  Genetics. What are the signs or symptoms? Symptoms of this condition include:  Brief changes in mood, such as going from extreme happiness to sadness.  Decreased concentration.  Difficulty sleeping.  Crying spells and tearfulness.  Loss of appetite.  Irritability.  Anxiety. If the symptoms of baby blues last for more than 2 weeks or become more severe, you may have postpartum depression. How is this diagnosed? This condition is diagnosed based on an evaluation of your  symptoms. There are no medical or lab tests that lead to a diagnosis, but there are various questionnaires that a health care provider may use to identify women with the baby blues or postpartum depression. How is this treated? Treatment is not needed for this condition. The baby blues usually go away on their own in 1-2 weeks. Social support is often all that is needed. You will be encouraged to get adequate sleep and rest. Follow these instructions at home: Lifestyle      Get as much rest as you can. Take a nap when the baby sleeps.  Exercise regularly as told by your health care provider. Some women find yoga and walking to be helpful.  Eat a balanced and nourishing diet. This includes plenty of fruits and vegetables, whole grains, and lean proteins.  Do little things that you enjoy. Have a cup of tea, take a bubble bath, read your favorite magazine, or listen to your favorite music.  Avoid alcohol.  Ask for help with household chores, cooking, grocery shopping, or running errands. Do not try to do everything yourself. Consider hiring a postpartum doula to help. This is a professional who specializes in providing support to new mothers.  Try not to make any major life changes during pregnancy or right after giving birth. This can add stress. General instructions  Talk to people close to you about how you are feeling. Get support  from your partner, family members, friends, or other new moms. You may want to join a support group.  Find ways to cope with stress. This may include: ? Writing your thoughts and feelings in a journal. ? Spending time outside. ? Spending time with people who make you laugh.  Try to stay positive in how you think. Think about the things you are grateful for.  Take over-the-counter and prescription medicines only as told by your health care provider.  Let your health care provider know if you have any concerns.  Keep all postpartum visits as told by your health care provider. This is important. Contact a health care provider if:  Your baby blues do not go away after 2 weeks. Get help right away if:  You have thoughts of taking your own life (suicidal thoughts).  You think you may harm the baby or other people.  You see or hear things that are not there (hallucinations). Summary  After giving birth, you may feel happy one minute and sad or stressed the next. Feelings of sadness that happen right after the baby is born and go away after a week or two are called the "baby blues."  You can manage the baby blues by getting enough rest, eating a healthy diet, exercising, spending time with supportive people, and finding ways to cope with stress.  If feelings of sadness and stress last longer than 2 weeks or get in the way of caring for your baby, talk to your health care provider. This may mean you have postpartum depression. This information is not intended to replace advice given to you by your health care provider. Make sure you discuss any questions you have with your health care provider. Document Revised: 07/10/2018 Document Reviewed: 05/14/2016 Elsevier Patient Education  2020 [employer]. Breastfeeding  Choosing to breastfeed is one of the best decisions you can make for yourself and your baby. A change in hormones during pregnancy causes your breasts to make breast milk in your  milk-producing glands. Hormones prevent breast milk from being released before your baby  is born. They also prompt milk flow after birth. Once breastfeeding has begun, thoughts of your baby, as well as his or her sucking or crying, can stimulate the release of milk from your milk-producing glands. Benefits of breastfeeding Research shows that breastfeeding offers many health benefits for infants and mothers. It also offers a cost-free and convenient way to feed your baby. For your baby  Your first milk (colostrum) helps your baby's digestive system to function better.  Special cells in your milk (antibodies) help your baby to fight off infections.  Breastfed babies are less likely to develop asthma, allergies, obesity, or type 2 diabetes. They are also at lower risk for sudden infant death syndrome (SIDS).  Nutrients in breast milk are better able to meet your baby's needs compared to infant formula.  Breast milk improves your baby's brain development. For you  Breastfeeding helps to create a very special bond between you and your baby.  Breastfeeding is convenient. Breast milk costs nothing and is always available at the correct temperature.  Breastfeeding helps to burn calories. It helps you to lose the weight that you gained during pregnancy.  Breastfeeding makes your uterus return faster to its size before pregnancy. It also slows bleeding (lochia) after you give birth.  Breastfeeding helps to lower your risk of developing type 2 diabetes, osteoporosis, rheumatoid arthritis, cardiovascular disease, and breast, ovarian, uterine, and endometrial cancer later in life. Breastfeeding basics Starting breastfeeding  Find a comfortable place to sit or lie down, with your neck and back well-supported.  Place a pillow or a rolled-up blanket under your baby to bring him or her to the level of your breast (if you are seated). Nursing pillows are specially designed to help support your arms and  your baby while you breastfeed.  Make sure that your baby's tummy (abdomen) is facing your abdomen.  Gently massage your breast. With your fingertips, massage from the outer edges of your breast inward toward the nipple. This encourages milk flow. If your milk flows slowly, you may need to continue this action during the feeding.  Support your breast with 4 fingers underneath and your thumb above your nipple (make the letter "C" with your hand). Make sure your fingers are well away from your nipple and your baby's mouth.  Stroke your baby's lips gently with your finger or nipple.  When your baby's mouth is open wide enough, quickly bring your baby to your breast, placing your entire nipple and as much of the areola as possible into your baby's mouth. The areola is the colored area around your nipple. ? More areola should be visible above your baby's upper lip than below the lower lip. ? Your baby's lips should be opened and extended outward (flanged) to ensure an adequate, comfortable latch. ? Your baby's tongue should be between his or her lower gum and your breast.  Make sure that your baby's mouth is correctly positioned around your nipple (latched). Your baby's lips should create a seal on your breast and be turned out (everted).  It is common for your baby to suck about 2-3 minutes in order to start the flow of breast milk. Latching Teaching your baby how to latch onto your breast properly is very important. An improper latch can cause nipple pain, decreased milk supply, and poor weight gain in your baby. Also, if your baby is not latched onto your nipple properly, he or she may swallow some air during feeding. This can make your baby fussy. Burping  your baby when you switch breasts during the feeding can help to get rid of the air. However, teaching your baby to latch on properly is still the best way to prevent fussiness from swallowing air while breastfeeding. Signs that your baby has  successfully latched onto your nipple  Silent tugging or silent sucking, without causing you pain. Infant's lips should be extended outward (flanged).  Swallowing heard between every 3-4 sucks once your milk has started to flow (after your let-down milk reflex occurs).  Muscle movement above and in front of his or her ears while sucking. Signs that your baby has not successfully latched onto your nipple  Sucking sounds or smacking sounds from your baby while breastfeeding.  Nipple pain. If you think your baby has not latched on correctly, slip your finger into the corner of your baby's mouth to break the suction and place it between your baby's gums. Attempt to start breastfeeding again. Signs of successful breastfeeding Signs from your baby  Your baby will gradually decrease the number of sucks or will completely stop sucking.  Your baby will fall asleep.  Your baby's body will relax.  Your baby will retain a small amount of milk in his or her mouth.  Your baby will let go of your breast by himself or herself. Signs from you  Breasts that have increased in firmness, weight, and size 1-3 hours after feeding.  Breasts that are softer immediately after breastfeeding.  Increased milk volume, as well as a change in milk consistency and color by the fifth day of breastfeeding.  Nipples that are not sore, cracked, or bleeding. Signs that your baby is getting enough milk  Wetting at least 1-2 diapers during the first 24 hours after birth.  Wetting at least 5-6 diapers every 24 hours for the first week after birth. The urine should be clear or pale yellow by the age of 5 days.  Wetting 6-8 diapers every 24 hours as your baby continues to grow and develop.  At least 3 stools in a 24-hour period by the age of 5 days. The stool should be soft and yellow.  At least 3 stools in a 24-hour period by the age of 7 days. The stool should be seedy and yellow.  No loss of weight greater  than 10% of birth weight during the first 3 days of life.  Average weight gain of 4-7 oz (113-198 g) per week after the age of 4 days.  Consistent daily weight gain by the age of 5 days, without weight loss after the age of 2 weeks. After a feeding, your baby may spit up a small amount of milk. This is normal. Breastfeeding frequency and duration Frequent feeding will help you make more milk and can prevent sore nipples and extremely full breasts (breast engorgement). Breastfeed when you feel the need to reduce the fullness of your breasts or when your baby shows signs of hunger. This is called "breastfeeding on demand." Signs that your baby is hungry include:  Increased alertness, activity, or restlessness.  Movement of the head from side to side.  Opening of the mouth when the corner of the mouth or cheek is stroked (rooting).  Increased sucking sounds, smacking lips, cooing, sighing, or squeaking.  Hand-to-mouth movements and sucking on fingers or hands.  Fussing or crying. Avoid introducing a pacifier to your baby in the first 4-6 weeks after your baby is born. After this time, you may choose to use a pacifier. Research has shown  that pacifier use during the first year of a baby's life decreases the risk of sudden infant death syndrome (SIDS). Allow your baby to feed on each breast as long as he or she wants. When your baby unlatches or falls asleep while feeding from the first breast, offer the second breast. Because newborns are often sleepy in the first few weeks of life, you may need to awaken your baby to get him or her to feed. Breastfeeding times will vary from baby to baby. However, the following rules can serve as a guide to help you make sure that your baby is properly fed:  Newborns (babies 37 weeks of age or younger) may breastfeed every 1-3 hours.  Newborns should not go without breastfeeding for longer than 3 hours during the day or 5 hours during the night.  You should  breastfeed your baby a minimum of 8 times in a 24-hour period. Breast milk pumping     Pumping and storing breast milk allows you to make sure that your baby is exclusively fed your breast milk, even at times when you are unable to breastfeed. This is especially important if you go back to work while you are still breastfeeding, or if you are not able to be present during feedings. Your lactation consultant can help you find a method of pumping that works best for you and give you guidelines about how long it is safe to store breast milk. Caring for your breasts while you breastfeed Nipples can become dry, cracked, and sore while breastfeeding. The following recommendations can help keep your breasts moisturized and healthy:  Avoid using soap on your nipples.  Wear a supportive bra designed especially for nursing. Avoid wearing underwire-style bras or extremely tight bras (sports bras).  Air-dry your nipples for 3-4 minutes after each feeding.  Use only cotton bra pads to absorb leaked breast milk. Leaking of breast milk between feedings is normal.  Use lanolin on your nipples after breastfeeding. Lanolin helps to maintain your skin's normal moisture barrier. Pure lanolin is not harmful (not toxic) to your baby. You may also hand express a few drops of breast milk and gently massage that milk into your nipples and allow the milk to air-dry. In the first few weeks after giving birth, some women experience breast engorgement. Engorgement can make your breasts feel heavy, warm, and tender to the touch. Engorgement peaks within 3-5 days after you give birth. The following recommendations can help to ease engorgement:  Completely empty your breasts while breastfeeding or pumping. You may want to start by applying warm, moist heat (in the shower or with warm, water-soaked hand towels) just before feeding or pumping. This increases circulation and helps the milk flow. If your baby does not completely  empty your breasts while breastfeeding, pump any extra milk after he or she is finished.  Apply ice packs to your breasts immediately after breastfeeding or pumping, unless this is too uncomfortable for you. To do this: ? Put ice in a plastic bag. ? Place a towel between your skin and the bag. ? Leave the ice on for 20 minutes, 2-3 times a day.  Make sure that your baby is latched on and positioned properly while breastfeeding. If engorgement persists after 48 hours of following these recommendations, contact your health care provider or a Science writer. Overall health care recommendations while breastfeeding  Eat 3 healthy meals and 3 snacks every day. Well-nourished mothers who are breastfeeding need an additional 450-500 calories a day.  You can meet this requirement by increasing the amount of a balanced diet that you eat.  Drink enough water to keep your urine pale yellow or clear.  Rest often, relax, and continue to take your prenatal vitamins to prevent fatigue, stress, and low vitamin and mineral levels in your body (nutrient deficiencies).  Do not use any products that contain nicotine or tobacco, such as cigarettes and e-cigarettes. Your baby may be harmed by chemicals from cigarettes that pass into breast milk and exposure to secondhand smoke. If you need help quitting, ask your health care provider.  Avoid alcohol.  Do not use illegal drugs or marijuana.  Talk with your health care provider before taking any medicines. These include over-the-counter and prescription medicines as well as vitamins and herbal supplements. Some medicines that may be harmful to your baby can pass through breast milk.  It is possible to become pregnant while breastfeeding. If birth control is desired, ask your health care provider about options that will be safe while breastfeeding your baby. Where to find more information: [employer]: www.llli.org Contact a health care  provider if:  You feel like you want to stop breastfeeding or have become frustrated with breastfeeding.  Your nipples are cracked or bleeding.  Your breasts are red, tender, or warm.  You have: ? Painful breasts or nipples. ? A swollen area on either breast. ? A fever or chills. ? Nausea or vomiting. ? Drainage other than breast milk from your nipples.  Your breasts do not become full before feedings by the fifth day after you give birth.  You feel sad and depressed.  Your baby is: ? Too sleepy to eat well. ? Having trouble sleeping. ? More than 29 week old and wetting fewer than 6 diapers in a 24-hour period. ? Not gaining weight by 67 days of age.  Your baby has fewer than 3 stools in a 24-hour period.  Your baby's skin or the white parts of his or her eyes become yellow. Get help right away if:  Your baby is overly tired (lethargic) and does not want to wake up and feed.  Your baby develops an unexplained fever. Summary  Breastfeeding offers many health benefits for infant and mothers.  Try to breastfeed your infant when he or she shows early signs of hunger.  Gently tickle or stroke your baby's lips with your finger or nipple to allow the baby to open his or her mouth. Bring the baby to your breast. Make sure that much of the areola is in your baby's mouth. Offer one side and burp the baby before you offer the other side.  Talk with your health care provider or lactation consultant if you have questions or you face problems as you breastfeed. This information is not intended to replace advice given to you by your health care provider. Make sure you discuss any questions you have with your health care provider. Document Revised: 06/12/2017 Document Reviewed: 04/19/2016 Elsevier Patient Education  2020 [employer] Breast Pumping Tips Breast pumping is a way to get milk out of your breasts. You will then store the milk for your baby to use when you are away from home.  There are three ways to pump. You can:  Use your hand to massage and squeeze your breast (hand expression).  Use a hand-held machine to manually pump your milk.  Use an electric machine to pump your milk. In the beginning you may not get much milk. After a  few days your breasts should make more. Pumping can help you start making milk after your baby is born. Pumping helps you to keep making milk when you are away from your baby. When should I pump? You can start pumping soon after your baby is born. Follow these tips:  When you are with your baby: ? Pump after you breastfeed. ? Pump from the free breast while you breastfeed.  When you are away from your baby: ? Pump every 2-3 hours for 15 minutes. ? Pump both breasts at the same time if you can.  If your baby drinks formula, pump around the time your baby gets the formula.  If you drank alcohol, wait 2 hours before you pump.  If you are going to have surgery, ask your doctor when you should pump again. How do I get ready to pump? Take steps to relax. Try these things to help your milk come in:  Smell your baby's blanket or clothes.  Look at a picture or video of your baby.  Sit in a quiet, private space.  Massage your breast and nipple.  Place a cloth on your breast. The cloth should be warm and a little wet.  Play relaxing music.  Picture your milk flowing. What are some tips? General tips for pumping breast milk   Always wash your hands before pumping.  If you do not get much milk or if pumping hurts, try different pump settings or a different kind of pump.  Drink enough fluid so your pee (urine) is clear or pale yellow.  Wear clothing that opens in the front or is easy to take off.  Pump milk into a clean bottle or container.  Do not use anything that has nicotine or tobacco. Examples are cigarettes and e-cigarettes. If you need help quitting, ask your doctor. Tips for storing breast milk   Store breast  milk in a clean, BPA-free container. These include: ? A glass or plastic bottle. ? A milk storage bag.  Store only 2-4 ounces of breast milk in each container.  Swirl the breast milk in the container. Do not shake it.  Write down the date you pumped the milk on the container.  This is how long you can store breast milk: ? Room temperature: 6-8 hours. It is best to use the milk within 4 hours. ? Cooler with ice packs: 24 hours. ? Refrigerator: 5-8 days, if the milk is clean. It is best to use the milk within 3 days. ? Freezer: 9-12 months, if the milk is clean and stored away from the freezer door. It is best to use the milk within 6 months.  Put milk in the back of the refrigerator or freezer.  Thaw frozen milk using warm water. Do not use the microwave. Tips for choosing a breast pump When choosing a pump, keep the following things in mind:  Manual breast pumps do not need electricity. They cost less. They can be hard to use.  Electric breast pumps use electricity. They are more expensive. They are easier to use. They collect more milk.  The suction cup (flange) should be the right size.  Before you buy the pump, check if your insurance will pay for it. Tips for caring for a breast pump  Check the manual that came with your pump for cleaning tips.  Clean the pump after you use it. To do this: ? Wipe down the electrical part. Use a dry cloth or paper towel. Do not put  this part in water or in cleaning products. ? Wash the plastic parts with soap and warm water. Or use the dishwasher if the manual says it is safe. You do not need to clean the tubing unless it touched breast milk. ? Let all the parts air dry. Avoid drying them with a cloth or towel. ? When the parts are clean and dry, put the pump back together. Then store the pump.  If there is water in the tubing when you want to pump: 1. Attach the tubing to the pump. 2. Turn on the pump. 3. Turn off the pump when the tube  is dry.  Try not to touch the inside of pump parts. Summary  Pumping can help you start making milk after your baby is born. It lets you keep making milk when you are away from your baby.  When you are away from your baby, pump for about 15 minutes every 2-3 hours. Pump both breasts at the same time, if you can. This information is not intended to replace advice given to you by your health care provider. Make sure you discuss any questions you have with your health care provider. Document Revised: 07/08/2018 Document Reviewed: 04/22/2016 Elsevier Patient Education  2020 [employer]

## 2019-12-31 NOTE — Discharge Summary (Signed)
Physician Obstetric Discharge Summary  Patient Name: Sherry Lynn DOB: [birth year] MRN: [id number]                            Discharge Summary  Date of Admission: 12/29/2019 Date of Discharge: 12/31/2019 Delivering Provider: Linzie Collin   Admitting Diagnosis: Postoperative state [Z98.890] at [redacted]w[redacted]d Secondary diagnosis:  Active Problems:   History of hemolysis, elevated liver enzymes, and low platelet (HELLP) syndrome   History of cesarean section   Declines VBAC (vaginal birth after cesarean) trial   Cesarean delivery delivered   [redacted] weeks gestation of pregnancy   Postoperative state   Mode of Delivery:       low uterine, transverse     Discharge diagnosis: Term Pregnancy Delivered        Repeat cesarean delivery  Intrapartum Procedures:    Post partum procedures:   Complications: none                     Discharge Day SOAP Note:  Subjective:  The patient has no complaints.  She is ambulating well. She is taking PO well. Pain is well controlled with current medications. Patient is urinating without difficulty.   She is passing flatus.  Her itching has resolved.  Objective  Vital signs: BP 129/85 (BP Location: Right Arm)   Pulse 77   Temp 98.6 F (37 C) (Oral)   Resp 20   Ht 5\' 1"  (1.549 m)   Wt 87.4 kg   LMP 03/31/2019 (Approximate)   SpO2 97%   Breastfeeding Unknown   BMI 36.39 kg/m   Physical Exam: Gen: NAD Abdomen:  clean, dry, no drainage Fundus Fundal Tone: Firm  Lochia Amount: Scant     Data Review Labs: Lab Results  Component Value Date   WBC 6.4 12/29/2019   HGB 8.4 (L) 12/29/2019   HCT 27.4 (L) 12/29/2019   MCV 76.3 (L) 12/29/2019   PLT 296 12/29/2019   CBC Latest Ref Rng & Units 12/29/2019 10/21/2019 07/02/2019  WBC 4.0 - 10.5 K/uL 6.4 7.4 5.5  Hemoglobin 12.0 - 15.0 g/dL 09/01/2019) 6.1(Y) 0.7(P  Hematocrit 36 - 46 % 27.4(L) 31.0(L) 37.8  Platelets 150 - 400 K/uL 296 348 407(H)   A POS  Edinburgh Score: Edinburgh Postnatal  Depression Scale Screening Tool 12/29/2019  I have been able to laugh and see the funny side of things. 0  I have looked forward with enjoyment to things. 0  I have blamed myself unnecessarily when things went wrong. 1  I have been anxious or worried for no good reason. 0  I have felt scared or panicky for no good reason. 0  Things have been getting on top of me. 1  I have been so unhappy that I have had difficulty sleeping. 0  I have felt sad or miserable. 0  I have been so unhappy that I have been crying. 0  The thought of harming myself has occurred to me. 0  Edinburgh Postnatal Depression Scale Total 2    Assessment:  Active Problems:   History of hemolysis, elevated liver enzymes, and low platelet (HELLP) syndrome   History of cesarean section   Declines VBAC (vaginal birth after cesarean) trial   Cesarean delivery delivered   [redacted] weeks gestation of pregnancy   Postoperative state   Doing well.  Normal progress as expected.  Plan:  Discharge to home  Modified rest as directed -  may slowly resume normal activities with restrictions  as discussed.  Medications as written.  See below for additional.      Discharge Instructions: Per After Visit Summary. Activity: Advance as tolerated. Pelvic rest for 6 weeks.  Also refer to After Visit Summary.  Wound care discussed. Diet: Regular Medications: Allergies as of 12/31/2019      Reactions   Other Swelling   Tree nuts   Grapefruit Extract Other (See Comments)   Reaction came up in allergy test      Medication List    STOP taking these medications   hydrOXYzine 25 MG capsule Commonly known as: VISTARIL     TAKE these medications   albuterol 108 (90 Base) MCG/ACT inhaler Commonly known as: VENTOLIN HFA Inhale 2 puffs into the lungs every 4 (four) hours as needed for wheezing.   Ferralet 90 90-1 MG Tabs Take 1 tablet by mouth daily.   mometasone 0.1 % cream Commonly known as: ELOCON Apply 1 application topically  daily as needed (Eczema).   multivitamin-prenatal 27-0.8 MG Tabs tablet Take 1 tablet by mouth daily at 12 noon.   oxyCODONE-acetaminophen 5-325 MG tablet Commonly known as: PERCOCET/ROXICET Take 1-2 tablets by mouth every 4 (four) hours as needed for moderate pain.   Vitamin D (Ergocalciferol) 1.25 MG (50000 UNIT) Caps capsule Commonly known as: DRISDOL Take 1 capsule (50,000 Units total) by mouth every 7 (seven) days.            Discharge Care Instructions  (From admission, onward)         Start     Ordered   12/31/19 0000  No dressing needed       Comments: Keep wound area clean and dry   12/31/19 0906         Outpatient follow up:  Postpartum contraception: Will discuss at first post-partum visit.  Discharged Condition: good  Discharged to: home  Newborn Data: Disposition:home with mother  Apgars: APGAR (1 MIN): 9   APGAR (5 MINS): 9   APGAR (10 MINS):    Baby Feeding: Bottle feeding and "pumping".  Elonda Husky, M.D. 12/31/2019 9:24 AM

## 2020-01-06 ENCOUNTER — Other Ambulatory Visit: Payer: Self-pay

## 2020-01-06 ENCOUNTER — Encounter: Payer: Self-pay | Admitting: Obstetrics and Gynecology

## 2020-01-06 ENCOUNTER — Ambulatory Visit (INDEPENDENT_AMBULATORY_CARE_PROVIDER_SITE_OTHER): Payer: BC Managed Care – PPO | Admitting: Obstetrics and Gynecology

## 2020-01-06 VITALS — BP 142/91 | HR 103 | Wt 174.4 lb

## 2020-01-06 DIAGNOSIS — Z9889 Other specified postprocedural states: Secondary | ICD-10-CM

## 2020-01-06 NOTE — Progress Notes (Signed)
HPI:      [name] is a 24 y.o. [id number] who LMP was No LMP recorded.  Subjective:   She presents today 1 week post op and postpartum.  She is pumping and plans to continue pumping.  She is not bottlefeeding except for breast milk. She reports that she is no longer having any pain.  She feels well.  She remains happy with her decision regarding cesarean delivery. She has not yet decided on method of birth control.    Hx: The following portions of the patient's history were reviewed and updated as appropriate:             She  has a past medical history of Anemia, Asthma, and HELLP syndrome. She does not have any pertinent problems on file. She  has a past surgical history that includes Cesarean section; Pilonidal cyst excision; and Cesarean section (N/A, 12/29/2019). Her family history includes Diabetes in her father; Healthy in her mother; Hypertension in her father. She  reports that she has never smoked. She has never used smokeless tobacco. She reports previous alcohol use. She reports that she does not use drugs. She has a current medication list which includes the following prescription(s): albuterol, ferralet 90, mometasone, oxycodone-acetaminophen, multivitamin-prenatal, and vitamin d (ergocalciferol). She is allergic to other and grapefruit extract.       Review of Systems:  Review of Systems  Constitutional: Denied constitutional symptoms, night sweats, recent illness, fatigue, fever, insomnia and weight loss.  Eyes: Denied eye symptoms, eye pain, photophobia, vision change and visual disturbance.  Ears/Nose/Throat/Neck: Denied ear, nose, throat or neck symptoms, hearing loss, nasal discharge, sinus congestion and sore throat.  Cardiovascular: Denied cardiovascular symptoms, arrhythmia, chest pain/pressure, edema, exercise intolerance, orthopnea and palpitations.  Respiratory: Denied pulmonary symptoms, asthma, pleuritic pain, productive sputum, cough, dyspnea and wheezing.   Gastrointestinal: Denied, gastro-esophageal reflux, melena, nausea and vomiting.  Genitourinary: Denied genitourinary symptoms including symptomatic vaginal discharge, pelvic relaxation issues, and urinary complaints.  Musculoskeletal: Denied musculoskeletal symptoms, stiffness, swelling, muscle weakness and myalgia.  Dermatologic: Denied dermatology symptoms, rash and scar.  Neurologic: Denied neurology symptoms, dizziness, headache, neck pain and syncope.  Psychiatric: Denied psychiatric symptoms, anxiety and depression.  Endocrine: Denied endocrine symptoms including hot flashes and night sweats.   Meds:   Current Outpatient Medications on File Prior to Visit  Medication Sig Dispense Refill  . albuterol (VENTOLIN HFA) 108 (90 Base) MCG/ACT inhaler Inhale 2 puffs into the lungs every 4 (four) hours as needed for wheezing.     Marland Kitchen Fe Cbn-Fe Gluc-FA-B12-C-DSS (FERRALET 90) 90-1 MG TABS Take 1 tablet by mouth daily. (Patient not taking: Reported on 12/24/2019) 30 tablet 3  . mometasone (ELOCON) 0.1 % cream Apply 1 application topically daily as needed (Eczema).     Marland Kitchen oxyCODONE-acetaminophen (PERCOCET/ROXICET) 5-325 MG tablet Take 1-2 tablets by mouth every 4 (four) hours as needed for moderate pain. 20 tablet 0  . Prenatal Vit-Fe Fumarate-FA (MULTIVITAMIN-PRENATAL) 27-0.8 MG TABS tablet Take 1 tablet by mouth daily at 12 noon.    . Vitamin D, Ergocalciferol, (DRISDOL) 1.25 MG (50000 UNIT) CAPS capsule Take 1 capsule (50,000 Units total) by mouth every 7 (seven) days. 12 capsule 0   No current facility-administered medications on file prior to visit.          Objective:     Vitals:   01/06/20 1208  BP: (!) 142/91  Pulse: (!) 103   Filed Weights   01/06/20 1208  Weight: 174 lb 6.4 oz (  79.1 kg)               Abdomen: Soft.  Non-tender.  No masses.  No HSM.  Incision/s: Intact.  Healing well.  No erythema.  No drainage.      Assessment:    [id number] Patient Active Problem  List   Diagnosis Date Noted  . Postoperative state 12/29/2019  . Cesarean delivery delivered   . [redacted] weeks gestation of pregnancy   . History of vitamin D deficiency 11/12/2019  . Back pain affecting pregnancy in third trimester 10/18/2019  . Declines VBAC (vaginal birth after cesarean) trial 09/28/2019  . Newborn product of in vitro fertilization (IVF) pregnancy 09/28/2019  . History of twin pregnancy in prior pregnancy 09/28/2019  . History of hemolysis, elevated liver enzymes, and low platelet (HELLP) syndrome 08/03/2019  . Pregnancy resulting from assisted reproductive technology in second trimester 08/03/2019  . History of maternal blood transfusion, currently pregnant 08/03/2019  . History of cesarean section 08/03/2019  . Supervision of high risk pregnancy, antepartum, third trimester 08/03/2019  . History of preterm delivery, currently pregnant in third trimester 08/03/2019     1. Post-operative state     Patient doing well postop.   Plan:            1.  Wound care discussed.  2.  Birth control methods discussed.  Patient leaning toward IUD at this time.  We will readdress this at her 5-week postop check. Orders No orders of the defined types were placed in this encounter.   No orders of the defined types were placed in this encounter.     F/U  Return in about 4 weeks (around 02/03/2020).  Elonda Husky, M.D. 01/06/2020 12:22 PM

## 2020-01-24 ENCOUNTER — Other Ambulatory Visit: Payer: Self-pay | Admitting: Obstetrics and Gynecology

## 2020-01-24 NOTE — Telephone Encounter (Signed)
I will refill this for her for now as she will likely need another round of Vitamin D supplementation. Please be sure to recheck her levels at her 6 week postpartum visit (Dr. Logan Bores did her C-section).

## 2020-02-04 ENCOUNTER — Ambulatory Visit (INDEPENDENT_AMBULATORY_CARE_PROVIDER_SITE_OTHER): Payer: Medicaid Other | Admitting: Obstetrics and Gynecology

## 2020-02-04 ENCOUNTER — Other Ambulatory Visit: Payer: Self-pay

## 2020-02-04 ENCOUNTER — Encounter: Payer: Self-pay | Admitting: Obstetrics and Gynecology

## 2020-02-04 VITALS — BP 124/80 | HR 76 | Ht 61.0 in | Wt 166.1 lb

## 2020-02-04 DIAGNOSIS — Z9889 Other specified postprocedural states: Secondary | ICD-10-CM

## 2020-02-04 DIAGNOSIS — E559 Vitamin D deficiency, unspecified: Secondary | ICD-10-CM

## 2020-02-04 NOTE — Progress Notes (Signed)
HPI:      [name] is a 24 y.o. [phone] who LMP was No LMP recorded.  Subjective:   She presents today approximately 6 weeks postpartum.  She continues to breast-feed full-time.  She states that she is doing well.  She has resumed intercourse without protection.  She says she has had no problems. Patient unsure whether she desires birth control.  She had difficulty conceiving after the birth of her twins and believes that she may be infertile not requiring birth control. Patient states that she has not been good about taking her vitamin D as directed.    Hx: The following portions of the patient's history were reviewed and updated as appropriate:             She  has a past medical history of Anemia, Asthma, and HELLP syndrome. She does not have any pertinent problems on file. She  has a past surgical history that includes Cesarean section; Pilonidal cyst excision; and Cesarean section (N/A, 12/29/2019). Her family history includes Diabetes in her father; Healthy in her mother; Hypertension in her father. She  reports that she has never smoked. She has never used smokeless tobacco. She reports previous alcohol use. She reports that she does not use drugs. She has a current medication list which includes the following prescription(s): albuterol, vitamin d (ergocalciferol), ferralet 90, mometasone, and multivitamin-prenatal. She is allergic to other and grapefruit extract.       Review of Systems:  Review of Systems  Constitutional: Denied constitutional symptoms, night sweats, recent illness, fatigue, fever, insomnia and weight loss.  Eyes: Denied eye symptoms, eye pain, photophobia, vision change and visual disturbance.  Ears/Nose/Throat/Neck: Denied ear, nose, throat or neck symptoms, hearing loss, nasal discharge, sinus congestion and sore throat.  Cardiovascular: Denied cardiovascular symptoms, arrhythmia, chest pain/pressure, edema, exercise intolerance, orthopnea and  palpitations.  Respiratory: Denied pulmonary symptoms, asthma, pleuritic pain, productive sputum, cough, dyspnea and wheezing.  Gastrointestinal: Denied, gastro-esophageal reflux, melena, nausea and vomiting.  Genitourinary: Denied genitourinary symptoms including symptomatic vaginal discharge, pelvic relaxation issues, and urinary complaints.  Musculoskeletal: Denied musculoskeletal symptoms, stiffness, swelling, muscle weakness and myalgia.  Dermatologic: Denied dermatology symptoms, rash and scar.  Neurologic: Denied neurology symptoms, dizziness, headache, neck pain and syncope.  Psychiatric: Denied psychiatric symptoms, anxiety and depression.  Endocrine: Denied endocrine symptoms including hot flashes and night sweats.   Meds:   Current Outpatient Medications on File Prior to Visit  Medication Sig Dispense Refill  . albuterol (VENTOLIN HFA) 108 (90 Base) MCG/ACT inhaler Inhale 2 puffs into the lungs every 4 (four) hours as needed for wheezing.     . Vitamin D, Ergocalciferol, (DRISDOL) 1.25 MG (50000 UNIT) CAPS capsule TAKE 1 CAPSULE (50,000 UNITS TOTAL) BY MOUTH EVERY 7 (SEVEN) DAYS. 12 capsule 0  . Fe Cbn-Fe Gluc-FA-B12-C-DSS (FERRALET 90) 90-1 MG TABS Take 1 tablet by mouth daily. (Patient not taking: Reported on 12/24/2019) 30 tablet 3  . mometasone (ELOCON) 0.1 % cream Apply 1 application topically daily as needed (Eczema).     . Prenatal Vit-Fe Fumarate-FA (MULTIVITAMIN-PRENATAL) 27-0.8 MG TABS tablet Take 1 tablet by mouth daily at 12 noon. (Patient not taking: Reported on 02/04/2020)     No current facility-administered medications on file prior to visit.       Upstream - 02/04/20 0959      Pregnancy Intention Screening   Does the patient want to become pregnant in the next year? No    Does the patient's partner want to  become pregnant in the next year? No    Would the patient like to discuss contraceptive options today? Yes      Contraception Wrap Up   Current Method No  Contraceptive Precautions    End Method No Contraception Precautions    Contraception Counseling Provided No          The pregnancy intention screening data noted above was reviewed. Potential methods of contraception were discussed. The patient elected to proceed with No Method - Other Reason.     Objective:     Vitals:   02/04/20 0953  BP: 124/80  Pulse: 76   Filed Weights   02/04/20 0953  Weight: 166 lb 1.6 oz (75.3 kg)               Abdomen: Soft.  Non-tender.  No masses.  No HSM.  Incision/s: Intact.  Healing well.  No erythema.  No drainage.   Pelvic examination   Pelvic:   Vulva: Normal appearance.  No lesions.  No abnormal scarring.    Vagina: No lesions or abnormalities noted.  Support: Normal pelvic support.  Urethra No masses tenderness or scarring.  Meatus Normal size without lesions or prolapse.  Cervix: Normal ectropion.  No lesions.  Anus: Normal exam.  No lesions.  Perineum: Normal exam.  No lesions.  Healed well.          Bimanual   Uterus: Normal size.  Non-tender.  Mobile.  AV.  Adnexae: No masses.  Non-tender to palpation.  Cul-de-sac: Negative for abnormality.      Assessment:    [id number] Patient Active Problem List   Diagnosis Date Noted  . Postoperative state 12/29/2019  . Cesarean delivery delivered   . [redacted] weeks gestation of pregnancy   . History of vitamin D deficiency 11/12/2019  . Back pain affecting pregnancy in third trimester 10/18/2019  . Declines VBAC (vaginal birth after cesarean) trial 09/28/2019  . Newborn product of in vitro fertilization (IVF) pregnancy 09/28/2019  . History of twin pregnancy in prior pregnancy 09/28/2019  . History of hemolysis, elevated liver enzymes, and low platelet (HELLP) syndrome 08/03/2019  . Pregnancy resulting from assisted reproductive technology in second trimester 08/03/2019  . History of maternal blood transfusion, currently pregnant 08/03/2019  . History of cesarean section 08/03/2019  .  Supervision of high risk pregnancy, antepartum, third trimester 08/03/2019  . History of preterm delivery, currently pregnant in third trimester 08/03/2019     1. Vitamin D deficiency   2. Postpartum care and examination immediately after delivery   3. Post-operative state     Patient doing very well postoperatively and postpartum.  She is breast-feeding full-time.    Plan:            1.  Patient may resume normal activities with exception of heavy lifting.  2.  Birth control options discussed in detail-necessity of birth control discussed.  Patient is not sure if she would like birth control and will inform [place] when she has made a decision.  3.  Encouraged to take vitamin D as directed.  We will test her vitamin D levels when she begins to take it more systematically.  Orders Orders Placed This Encounter  Procedures  . VITAMIN D 25 Hydroxy (Vit-D Deficiency, Fractures)    No orders of the defined types were placed in this encounter.     F/U  Return for Annual Physical.  Elonda Husky, M.D. 02/04/2020 10:30 AM

## 2020-03-13 ENCOUNTER — Other Ambulatory Visit: Payer: Self-pay

## 2020-03-13 ENCOUNTER — Ambulatory Visit
Admission: EM | Admit: 2020-03-13 | Discharge: 2020-03-13 | Disposition: A | Discharge status: Home / Self Care | Payer: Medicaid Other | Attending: Family Medicine | Admitting: Family Medicine

## 2020-03-13 DIAGNOSIS — L301 Dyshidrosis [pompholyx]: Secondary | ICD-10-CM | POA: Diagnosis not present

## 2020-03-13 DIAGNOSIS — T7805XA Anaphylactic reaction due to tree nuts and seeds, initial encounter: Secondary | ICD-10-CM | POA: Diagnosis not present

## 2020-03-13 DIAGNOSIS — J4520 Mild intermittent asthma, uncomplicated: Secondary | ICD-10-CM | POA: Diagnosis not present

## 2020-03-15 ENCOUNTER — Encounter: Payer: Medicaid Other | Admitting: Obstetrics and Gynecology

## 2020-03-16 ENCOUNTER — Encounter: Payer: Medicaid Other | Admitting: Obstetrics and Gynecology

## 2020-04-06 ENCOUNTER — Other Ambulatory Visit: Payer: Self-pay | Admitting: Obstetrics and Gynecology

## 2020-04-07 ENCOUNTER — Encounter: Payer: Self-pay | Admitting: Emergency Medicine

## 2020-04-07 ENCOUNTER — Ambulatory Visit
Admission: EM | Admit: 2020-04-07 | Discharge: 2020-04-07 | Disposition: A | Discharge status: Home / Self Care | Payer: Medicaid Other | Attending: Internal Medicine | Admitting: Internal Medicine

## 2020-04-07 ENCOUNTER — Other Ambulatory Visit: Payer: Self-pay

## 2020-04-07 DIAGNOSIS — L0501 Pilonidal cyst with abscess: Secondary | ICD-10-CM

## 2020-04-07 MED ORDER — DOXYCYCLINE HYCLATE 100 MG PO CAPS: 100.0000 mg | ORAL_CAPSULE | Freq: Two times a day (BID) | ORAL | 0 refills | Status: DC

## 2020-04-07 NOTE — ED Triage Notes (Signed)
Patient states she has a boil or cyst at the top of her buttock area x 2-3 days. She does report she has had this before and she had to have the area surgically drained.

## 2020-04-07 NOTE — Discharge Instructions (Addendum)
Apply heat to the abscess area 15 min 3-5 times a day for a few days Come back if area gets larger and more painful, may need draining which is not ready today.

## 2020-04-07 NOTE — ED Provider Notes (Signed)
[facility]    CSN: [id number] Arrival date & time: 04/07/20  0906      History   Chief Complaint Chief Complaint  Patient presents with  . Recurrent Skin Infections    HPI Sherry Lynn is a 25 y.o. female who is here for recurrent pylonidal abscess x 2-3 days. Has had it drained in the past and had surgery to remove it as well.     Past Medical History:  Diagnosis Date  . Anemia   . Asthma    well controlled  . HELLP syndrome     Patient Active Problem List   Diagnosis Date Noted  . Postoperative state 12/29/2019  . Cesarean delivery delivered   . [redacted] weeks gestation of pregnancy   . History of vitamin D deficiency 11/12/2019  . Back pain affecting pregnancy in third trimester 10/18/2019  . Declines VBAC (vaginal birth after cesarean) trial 09/28/2019  . Newborn product of in vitro fertilization (IVF) pregnancy 09/28/2019  . History of twin pregnancy in prior pregnancy 09/28/2019  . History of hemolysis, elevated liver enzymes, and low platelet (HELLP) syndrome 08/03/2019  . Pregnancy resulting from assisted reproductive technology in second trimester 08/03/2019  . History of maternal blood transfusion, currently pregnant 08/03/2019  . History of cesarean section 08/03/2019  . Supervision of high risk pregnancy, antepartum, third trimester 08/03/2019  . History of preterm delivery, currently pregnant in third trimester 08/03/2019    Past Surgical History:  Procedure Laterality Date  . CESAREAN SECTION    . CESAREAN SECTION N/A 12/29/2019   Procedure: CESAREAN SECTION;  Surgeon: Linzie Collin, MD;  Location: [facility] ORS;  Service: Obstetrics;  Laterality: N/A;  Repeat Section  . PILONIDAL CYST EXCISION      OB History    Gravida  2   Para  2   Term  1   Preterm  1   AB      Living  3     SAB      IAB      Ectopic      Multiple  1   Live Births  3            Home Medications    Prior to Admission medications    Medication Sig Start Date End Date Taking? Authorizing Provider  doxycycline (VIBRAMYCIN) 100 MG capsule Take 1 capsule (100 mg total) by mouth 2 (two) times daily. 04/07/20  Yes Rodriguez-Southworth, Nettie Elm, PA-C  albuterol (VENTOLIN HFA) 108 (90 Base) MCG/ACT inhaler Inhale 2 puffs into the lungs every 4 (four) hours as needed for wheezing.  06/26/14 04/07/20  [provider]    Family History Family History  Problem Relation Age of Onset  . Healthy Mother   . Hypertension Father   . Diabetes Father     Social History Social History   Tobacco Use  . Smoking status: Never Smoker  . Smokeless tobacco: Never Used  Vaping Use  . Vaping Use: Former  Substance Use Topics  . Alcohol use: Not Currently  . Drug use: Never     Allergies   Other and Grapefruit extract   Review of Systems Review of Systems  Constitutional: Negative for fever.  Musculoskeletal: Negative for myalgias.  Skin:       Lump which is tender on buttocks area     Physical Exam Triage Vital Signs ED Triage Vitals  Enc Vitals Group     BP 04/07/20 1044 129/82     Pulse Rate  04/07/20 1044 84     Resp 04/07/20 1044 18     Temp 04/07/20 1044 98.2 F (36.8 C)     Temp Source 04/07/20 1044 Oral     SpO2 04/07/20 1044 100 %     Weight 04/07/20 1042 165 lb (74.8 kg)     Height 04/07/20 1042 5\' 1"  (1.549 m)     Head Circumference --      Peak Flow --      Pain Score 04/07/20 1042 6     Pain Loc --      Pain Edu? --      Excl. in GC? --    No data found.  Updated Vital Signs BP 129/82 (BP Location: Right Arm)   Pulse 84   Temp 98.2 F (36.8 C) (Oral)   Resp 18   Ht 5\' 1"  (1.549 m)   Wt 165 lb (74.8 kg)   LMP 03/17/2020   SpO2 100%   BMI 31.18 kg/m   Visual Acuity Right Eye Distance:   Left Eye Distance:   Bilateral Distance:    Right Eye Near:   Left Eye Near:    Bilateral Near:     Physical Exam Skin:    Comments: On upper L gluteal fold she has erythema and induration  and tenderness, is milder and smaller on the R of the gluteal fold. Size is about 1 cm wide and 1.5 cm long on the L.       UC Treatments / Results  Labs (all labs ordered are listed, but only abnormal results are displayed) Labs Reviewed - No data to display  EKG   Radiology No results found.  Procedures Procedures (including critical care time)  Medications Ordered in UC Medications - No data to display  Initial Impression / Assessment and Plan / UC Course  I have reviewed the triage vital signs and the nursing notes. Having recurrent pilonidal cyst abscess. I placed her on Doxy as noted. See instructions.  Final Clinical Impressions(s) / UC Diagnoses   Final diagnoses:  Pilonidal abscess     Discharge Instructions     Apply heat to the abscess area 15 min 3-5 times a day for a few days Come back if area gets larger and more painful, may need draining which is not ready today.     ED Prescriptions    Medication Sig Dispense Auth. Provider   doxycycline (VIBRAMYCIN) 100 MG capsule Take 1 capsule (100 mg total) by mouth 2 (two) times daily. 20 capsule Rodriguez-Southworth, , PA-C     PDMP not reviewed this encounter.   03/19/2020, PA-C 04/07/20 1104

## 2020-04-10 DIAGNOSIS — L0591 Pilonidal cyst without abscess: Secondary | ICD-10-CM | POA: Diagnosis not present

## 2020-04-10 DIAGNOSIS — F900 Attention-deficit hyperactivity disorder, predominantly inattentive type: Secondary | ICD-10-CM | POA: Diagnosis not present

## 2020-04-10 DIAGNOSIS — L301 Dyshidrosis [pompholyx]: Secondary | ICD-10-CM | POA: Diagnosis not present

## 2020-04-10 DIAGNOSIS — E559 Vitamin D deficiency, unspecified: Secondary | ICD-10-CM | POA: Diagnosis not present

## 2020-05-22 DIAGNOSIS — F900 Attention-deficit hyperactivity disorder, predominantly inattentive type: Secondary | ICD-10-CM | POA: Diagnosis not present

## 2020-05-22 DIAGNOSIS — L0591 Pilonidal cyst without abscess: Secondary | ICD-10-CM | POA: Diagnosis not present

## 2020-09-28 DIAGNOSIS — J029 Acute pharyngitis, unspecified: Secondary | ICD-10-CM | POA: Diagnosis not present

## 2020-10-11 DIAGNOSIS — Z760 Encounter for issue of repeat prescription: Secondary | ICD-10-CM | POA: Diagnosis not present

## 2020-10-11 DIAGNOSIS — Z79899 Other long term (current) drug therapy: Secondary | ICD-10-CM | POA: Diagnosis not present

## 2020-10-11 DIAGNOSIS — L301 Dyshidrosis [pompholyx]: Secondary | ICD-10-CM | POA: Diagnosis not present

## 2020-10-11 DIAGNOSIS — F900 Attention-deficit hyperactivity disorder, predominantly inattentive type: Secondary | ICD-10-CM | POA: Diagnosis not present

## 2020-10-11 DIAGNOSIS — L309 Dermatitis, unspecified: Secondary | ICD-10-CM | POA: Diagnosis not present

## 2021-01-05 DIAGNOSIS — F900 Attention-deficit hyperactivity disorder, predominantly inattentive type: Secondary | ICD-10-CM | POA: Diagnosis not present

## 2021-01-05 DIAGNOSIS — Z79899 Other long term (current) drug therapy: Secondary | ICD-10-CM | POA: Diagnosis not present

## 2021-01-05 DIAGNOSIS — R0989 Other specified symptoms and signs involving the circulatory and respiratory systems: Secondary | ICD-10-CM | POA: Diagnosis not present

## 2021-01-05 DIAGNOSIS — Z9189 Other specified personal risk factors, not elsewhere classified: Secondary | ICD-10-CM | POA: Diagnosis not present

## 2021-01-15 DIAGNOSIS — Z23 Encounter for immunization: Secondary | ICD-10-CM | POA: Diagnosis not present

## 2021-01-16 DIAGNOSIS — Z111 Encounter for screening for respiratory tuberculosis: Secondary | ICD-10-CM | POA: Diagnosis not present

## 2021-01-18 DIAGNOSIS — Z111 Encounter for screening for respiratory tuberculosis: Secondary | ICD-10-CM | POA: Diagnosis not present

## 2021-01-22 DIAGNOSIS — Z23 Encounter for immunization: Secondary | ICD-10-CM | POA: Diagnosis not present

## 2021-01-22 DIAGNOSIS — Z020 Encounter for examination for admission to educational institution: Secondary | ICD-10-CM | POA: Diagnosis not present

## 2021-02-18 ENCOUNTER — Other Ambulatory Visit: Payer: Self-pay

## 2021-02-18 ENCOUNTER — Ambulatory Visit
Admission: EM | Admit: 2021-02-18 | Discharge: 2021-02-18 | Disposition: A | Discharge status: Home / Self Care | Payer: Medicaid Other | Attending: Emergency Medicine | Admitting: Emergency Medicine

## 2021-02-18 DIAGNOSIS — L0591 Pilonidal cyst without abscess: Secondary | ICD-10-CM

## 2021-02-18 MED ORDER — SULFAMETHOXAZOLE-TRIMETHOPRIM 800-160 MG PO TABS: 2.0000 | ORAL_TABLET | Freq: Two times a day (BID) | ORAL | 0 refills | Status: AC

## 2021-02-18 MED ORDER — IBUPROFEN 600 MG PO TABS: 600.0000 mg | ORAL_TABLET | Freq: Four times a day (QID) | ORAL | 0 refills | Status: AC | PRN

## 2021-02-18 NOTE — Discharge Instructions (Addendum)
Take 600 mg of ibuprofen combined with 1000 mg of Tylenol together 3-4 times a day as needed for pain.  Warm compresses/sitz baths.  Finish the antibiotics, even if you feel better.  In the follow-up with Dr. Tonna Boehringer, surgeon here in [place],  or with a surgeon in your area ASAP.

## 2021-02-18 NOTE — ED Provider Notes (Signed)
HPI  SUBJECTIVE:  Sherry Lynn is a 25 y.o. female who presents with a recurrent painful mass above the gluteal cleft starting 2 weeks ago.  Patient states that it is getting bigger and more painful.  No drainage, body aches, fevers.  She states this is in the same place where she had a pilonidal cyst removed in 2013.  She had recurrent symptoms earlier this year, and was referred to surgery, however, patient was not able to follow-up with them.  Symptoms are worse with sitting on it.  No alleviating factors.  She has not tried anything for this.  She has no past medical history.  LMP: August.  She denies the possibility being pregnant.  PMD: [facility], although the patient lives in [place].  Patient was seen at this urgent care and another urgent care earlier this year for identical symptoms, put on 2 rounds of doxycycline, which did not work for her.  She ended up taking Bactrim which cleared her symptoms.  Past Medical History:  Diagnosis Date   Anemia    Asthma    well controlled   HELLP syndrome     Past Surgical History:  Procedure Laterality Date   CESAREAN SECTION     CESAREAN SECTION N/A 12/29/2019   Procedure: CESAREAN SECTION;  Surgeon: Linzie Collin, MD;  Location: [facility] ORS;  Service: Obstetrics;  Laterality: N/A;  Repeat Section   PILONIDAL CYST EXCISION      Family History  Problem Relation Age of Onset   Healthy Mother    Hypertension Father    Diabetes Father     Social History   Tobacco Use   Smoking status: Never   Smokeless tobacco: Never  Vaping Use   Vaping Use: Former  Substance Use Topics   Alcohol use: Not Currently   Drug use: Never    No current facility-administered medications for this encounter.  Current Outpatient Medications:    ibuprofen (ADVIL) 600 MG tablet, Take 1 tablet (600 mg total) by mouth every 6 (six) hours as needed., Disp: 30 tablet, Rfl: 0   lisdexamfetamine (VYVANSE) 30 MG capsule, Take 30 mg by mouth daily.,  Disp: , Rfl:    sulfamethoxazole-trimethoprim (BACTRIM DS) 800-160 MG tablet, Take 2 tablets by mouth 2 (two) times daily for 7 days., Disp: 28 tablet, Rfl: 0  Allergies  Allergen Reactions   Other Swelling    Tree nuts    Grapefruit Extract Other (See Comments)    Reaction came up in allergy test         ROS  As noted in HPI.   Physical Exam  BP 129/70 (BP Location: Left Arm)   Pulse 87   Temp 98.7 F (37.1 C) (Oral)   Resp 16   Ht 5\' 1"  (1.549 m)   Wt 68.9 kg   SpO2 100%   BMI 28.72 kg/m   Constitutional: Well developed, well nourished, no acute distress Eyes:  EOMI, conjunctiva normal bilaterally HENT: Normocephalic, atraumatic,mucus membranes moist Respiratory: Normal inspiratory effort Cardiovascular: Normal rate GI: nondistended skin: Tender mass at the top of the gluteal cleft.  No erythema, induration, swelling, fluctuance. Musculoskeletal: no deformities Neurologic: Alert & oriented x 3, no focal neuro deficits Psychiatric: Speech and behavior appropriate   ED Course   Medications - No data to display  No orders of the defined types were placed in this encounter.   No results found for this or any previous visit (from the past 24 hour(s)). No results found.  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  As noted in HPI.   Patient with recurrent pilonidal cyst.  There is nothing to I&D today.  will put on Bactrim for 7 days. Pt took 2 tabs bid last time which resolved her sx.  Tylenol/ibuprofen, warm compresses/sitz baths.  She would like a referral to a surgeon in [place], told her that would be happy to place a referral for a local surgeon here, but that she may need to be referred within the [facility] system to have a surgeon in [place] area.   Discussed MDM, treatment plan, and plan for follow-up with patient. patient agrees with plan.   Meds ordered  this encounter  Medications   sulfamethoxazole-trimethoprim (BACTRIM DS) 800-160 MG tablet    Sig: Take 2 tablets by mouth 2 (two) times daily for 7 days.    Dispense:  28 tablet    Refill:  0   ibuprofen (ADVIL) 600 MG tablet    Sig: Take 1 tablet (600 mg total) by mouth every 6 (six) hours as needed.    Dispense:  30 tablet    Refill:  0      *This clinic note was created using Scientist, clinical (histocompatibility and immunogenetics). Therefore, there may be occasional mistakes despite careful proofreading.  ?    Domenick Gong, MD 02/19/21 [phone]

## 2021-02-18 NOTE — ED Triage Notes (Signed)
Pt here with C/O abscess on buttocks for had a pilonidal cyst removed in 2013, thinks it is coming back. Wants antibiotics until she can get in with a surgeon, was supposed to see one back in February but never got a round to it.

## 2021-02-20 DIAGNOSIS — M549 Dorsalgia, unspecified: Secondary | ICD-10-CM | POA: Diagnosis not present

## 2021-02-20 DIAGNOSIS — Z91048 Other nonmedicinal substance allergy status: Secondary | ICD-10-CM | POA: Diagnosis not present

## 2021-02-20 DIAGNOSIS — Z87891 Personal history of nicotine dependence: Secondary | ICD-10-CM | POA: Diagnosis not present

## 2021-02-20 DIAGNOSIS — G43909 Migraine, unspecified, not intractable, without status migrainosus: Secondary | ICD-10-CM | POA: Diagnosis not present

## 2021-02-20 DIAGNOSIS — Z79899 Other long term (current) drug therapy: Secondary | ICD-10-CM | POA: Diagnosis not present

## 2021-02-20 DIAGNOSIS — Z91018 Allergy to other foods: Secondary | ICD-10-CM | POA: Diagnosis not present

## 2021-02-20 DIAGNOSIS — L0501 Pilonidal cyst with abscess: Secondary | ICD-10-CM | POA: Diagnosis not present

## 2021-02-20 DIAGNOSIS — Z791 Long term (current) use of non-steroidal anti-inflammatories (NSAID): Secondary | ICD-10-CM | POA: Diagnosis not present

## 2021-02-23 ENCOUNTER — Telehealth: Payer: Self-pay

## 2021-02-23 NOTE — Telephone Encounter (Signed)
Transition Care Management Unsuccessful Follow-up Telephone Call  Date of discharge and from where:  02/21/2021-[facility]   Attempts:  1st Attempt  Reason for unsuccessful TCM follow-up call:  Left voice message

## 2021-02-26 NOTE — Telephone Encounter (Signed)
Transition Care Management Unsuccessful Follow-up Telephone Call  Date of discharge and from where:  02/21/2021 from [facility]  Attempts:  2nd Attempt  Reason for unsuccessful TCM follow-up call:  Left voice message

## 2021-02-27 NOTE — Telephone Encounter (Signed)
Transition Care Management Unsuccessful Follow-up Telephone Call  Date of discharge and from where:  02/21/2021 from [facility]  Attempts:  3rd Attempt  Reason for unsuccessful TCM follow-up call:  Unable to reach patient

## 2021-03-02 DIAGNOSIS — Z6828 Body mass index (BMI) 28.0-28.9, adult: Secondary | ICD-10-CM | POA: Diagnosis not present

## 2021-03-02 DIAGNOSIS — L0591 Pilonidal cyst without abscess: Secondary | ICD-10-CM | POA: Diagnosis not present

## 2021-03-30 DIAGNOSIS — L0591 Pilonidal cyst without abscess: Secondary | ICD-10-CM | POA: Diagnosis not present

## 2021-04-03 DIAGNOSIS — F988 Other specified behavioral and emotional disorders with onset usually occurring in childhood and adolescence: Secondary | ICD-10-CM | POA: Diagnosis not present

## 2021-04-03 DIAGNOSIS — K130 Diseases of lips: Secondary | ICD-10-CM | POA: Diagnosis not present

## 2021-04-12 DIAGNOSIS — M545 Low back pain, unspecified: Secondary | ICD-10-CM | POA: Diagnosis not present

## 2021-04-12 DIAGNOSIS — M9901 Segmental and somatic dysfunction of cervical region: Secondary | ICD-10-CM | POA: Diagnosis not present

## 2021-04-12 DIAGNOSIS — M9902 Segmental and somatic dysfunction of thoracic region: Secondary | ICD-10-CM | POA: Diagnosis not present

## 2021-04-12 DIAGNOSIS — M50121 Cervical disc disorder at C4-C5 level with radiculopathy: Secondary | ICD-10-CM | POA: Diagnosis not present

## 2021-04-12 DIAGNOSIS — M9903 Segmental and somatic dysfunction of lumbar region: Secondary | ICD-10-CM | POA: Diagnosis not present

## 2021-04-12 DIAGNOSIS — M546 Pain in thoracic spine: Secondary | ICD-10-CM | POA: Diagnosis not present

## 2021-04-12 DIAGNOSIS — M542 Cervicalgia: Secondary | ICD-10-CM | POA: Diagnosis not present

## 2021-04-16 DIAGNOSIS — M545 Low back pain, unspecified: Secondary | ICD-10-CM | POA: Diagnosis not present

## 2021-04-16 DIAGNOSIS — M9902 Segmental and somatic dysfunction of thoracic region: Secondary | ICD-10-CM | POA: Diagnosis not present

## 2021-04-16 DIAGNOSIS — M50121 Cervical disc disorder at C4-C5 level with radiculopathy: Secondary | ICD-10-CM | POA: Diagnosis not present

## 2021-04-16 DIAGNOSIS — M9901 Segmental and somatic dysfunction of cervical region: Secondary | ICD-10-CM | POA: Diagnosis not present

## 2021-04-16 DIAGNOSIS — M9903 Segmental and somatic dysfunction of lumbar region: Secondary | ICD-10-CM | POA: Diagnosis not present

## 2021-04-16 DIAGNOSIS — M546 Pain in thoracic spine: Secondary | ICD-10-CM | POA: Diagnosis not present

## 2021-04-16 DIAGNOSIS — M542 Cervicalgia: Secondary | ICD-10-CM | POA: Diagnosis not present

## 2021-04-18 DIAGNOSIS — R42 Dizziness and giddiness: Secondary | ICD-10-CM | POA: Diagnosis not present

## 2021-04-18 DIAGNOSIS — M9901 Segmental and somatic dysfunction of cervical region: Secondary | ICD-10-CM | POA: Diagnosis not present

## 2021-04-18 DIAGNOSIS — I959 Hypotension, unspecified: Secondary | ICD-10-CM | POA: Diagnosis not present

## 2021-04-18 DIAGNOSIS — L299 Pruritus, unspecified: Secondary | ICD-10-CM | POA: Diagnosis not present

## 2021-04-18 DIAGNOSIS — R55 Syncope and collapse: Secondary | ICD-10-CM | POA: Diagnosis not present

## 2021-04-18 DIAGNOSIS — R109 Unspecified abdominal pain: Secondary | ICD-10-CM | POA: Diagnosis not present

## 2021-04-18 DIAGNOSIS — M9902 Segmental and somatic dysfunction of thoracic region: Secondary | ICD-10-CM | POA: Diagnosis not present

## 2021-04-18 DIAGNOSIS — R1030 Lower abdominal pain, unspecified: Secondary | ICD-10-CM | POA: Diagnosis not present

## 2021-04-18 DIAGNOSIS — M549 Dorsalgia, unspecified: Secondary | ICD-10-CM | POA: Diagnosis not present

## 2021-04-18 DIAGNOSIS — M9903 Segmental and somatic dysfunction of lumbar region: Secondary | ICD-10-CM | POA: Diagnosis not present

## 2021-04-18 DIAGNOSIS — M542 Cervicalgia: Secondary | ICD-10-CM | POA: Diagnosis not present

## 2021-04-18 DIAGNOSIS — M545 Low back pain, unspecified: Secondary | ICD-10-CM | POA: Diagnosis not present

## 2021-04-18 DIAGNOSIS — W19XXXA Unspecified fall, initial encounter: Secondary | ICD-10-CM | POA: Diagnosis not present

## 2021-04-18 DIAGNOSIS — R0902 Hypoxemia: Secondary | ICD-10-CM | POA: Diagnosis not present

## 2021-04-18 DIAGNOSIS — M50121 Cervical disc disorder at C4-C5 level with radiculopathy: Secondary | ICD-10-CM | POA: Diagnosis not present

## 2021-04-18 DIAGNOSIS — M546 Pain in thoracic spine: Secondary | ICD-10-CM | POA: Diagnosis not present

## 2021-04-23 DIAGNOSIS — M542 Cervicalgia: Secondary | ICD-10-CM | POA: Diagnosis not present

## 2021-04-23 DIAGNOSIS — M9901 Segmental and somatic dysfunction of cervical region: Secondary | ICD-10-CM | POA: Diagnosis not present

## 2021-04-23 DIAGNOSIS — M545 Low back pain, unspecified: Secondary | ICD-10-CM | POA: Diagnosis not present

## 2021-04-23 DIAGNOSIS — M50121 Cervical disc disorder at C4-C5 level with radiculopathy: Secondary | ICD-10-CM | POA: Diagnosis not present

## 2021-04-23 DIAGNOSIS — M9903 Segmental and somatic dysfunction of lumbar region: Secondary | ICD-10-CM | POA: Diagnosis not present

## 2021-04-23 DIAGNOSIS — M546 Pain in thoracic spine: Secondary | ICD-10-CM | POA: Diagnosis not present

## 2021-04-23 DIAGNOSIS — M9902 Segmental and somatic dysfunction of thoracic region: Secondary | ICD-10-CM | POA: Diagnosis not present

## 2021-05-30 DIAGNOSIS — M546 Pain in thoracic spine: Secondary | ICD-10-CM | POA: Diagnosis not present

## 2021-05-30 DIAGNOSIS — M50121 Cervical disc disorder at C4-C5 level with radiculopathy: Secondary | ICD-10-CM | POA: Diagnosis not present

## 2021-05-30 DIAGNOSIS — M9901 Segmental and somatic dysfunction of cervical region: Secondary | ICD-10-CM | POA: Diagnosis not present

## 2021-05-30 DIAGNOSIS — M545 Low back pain, unspecified: Secondary | ICD-10-CM | POA: Diagnosis not present

## 2021-05-30 DIAGNOSIS — M9903 Segmental and somatic dysfunction of lumbar region: Secondary | ICD-10-CM | POA: Diagnosis not present

## 2021-05-30 DIAGNOSIS — M542 Cervicalgia: Secondary | ICD-10-CM | POA: Diagnosis not present

## 2021-05-30 DIAGNOSIS — M9902 Segmental and somatic dysfunction of thoracic region: Secondary | ICD-10-CM | POA: Diagnosis not present

## 2021-06-06 DIAGNOSIS — Z20822 Contact with and (suspected) exposure to covid-19: Secondary | ICD-10-CM | POA: Diagnosis not present

## 2021-06-06 DIAGNOSIS — J039 Acute tonsillitis, unspecified: Secondary | ICD-10-CM | POA: Diagnosis not present

## 2021-06-12 DIAGNOSIS — M542 Cervicalgia: Secondary | ICD-10-CM | POA: Diagnosis not present

## 2021-06-12 DIAGNOSIS — M9901 Segmental and somatic dysfunction of cervical region: Secondary | ICD-10-CM | POA: Diagnosis not present

## 2021-06-12 DIAGNOSIS — M546 Pain in thoracic spine: Secondary | ICD-10-CM | POA: Diagnosis not present

## 2021-06-12 DIAGNOSIS — M9903 Segmental and somatic dysfunction of lumbar region: Secondary | ICD-10-CM | POA: Diagnosis not present

## 2021-06-12 DIAGNOSIS — M50121 Cervical disc disorder at C4-C5 level with radiculopathy: Secondary | ICD-10-CM | POA: Diagnosis not present

## 2021-06-12 DIAGNOSIS — M545 Low back pain, unspecified: Secondary | ICD-10-CM | POA: Diagnosis not present

## 2021-06-12 DIAGNOSIS — M9902 Segmental and somatic dysfunction of thoracic region: Secondary | ICD-10-CM | POA: Diagnosis not present

## 2021-07-06 DIAGNOSIS — J4520 Mild intermittent asthma, uncomplicated: Secondary | ICD-10-CM | POA: Diagnosis not present

## 2021-07-06 DIAGNOSIS — F900 Attention-deficit hyperactivity disorder, predominantly inattentive type: Secondary | ICD-10-CM | POA: Diagnosis not present

## 2021-07-06 DIAGNOSIS — R55 Syncope and collapse: Secondary | ICD-10-CM | POA: Diagnosis not present

## 2021-07-10 DIAGNOSIS — R5383 Other fatigue: Secondary | ICD-10-CM | POA: Diagnosis not present

## 2021-07-12 DIAGNOSIS — M545 Low back pain, unspecified: Secondary | ICD-10-CM | POA: Diagnosis not present

## 2021-07-12 DIAGNOSIS — M50121 Cervical disc disorder at C4-C5 level with radiculopathy: Secondary | ICD-10-CM | POA: Diagnosis not present

## 2021-07-12 DIAGNOSIS — M542 Cervicalgia: Secondary | ICD-10-CM | POA: Diagnosis not present

## 2021-07-12 DIAGNOSIS — M9901 Segmental and somatic dysfunction of cervical region: Secondary | ICD-10-CM | POA: Diagnosis not present

## 2021-07-12 DIAGNOSIS — M546 Pain in thoracic spine: Secondary | ICD-10-CM | POA: Diagnosis not present

## 2021-07-12 DIAGNOSIS — M9902 Segmental and somatic dysfunction of thoracic region: Secondary | ICD-10-CM | POA: Diagnosis not present

## 2021-07-12 DIAGNOSIS — M9903 Segmental and somatic dysfunction of lumbar region: Secondary | ICD-10-CM | POA: Diagnosis not present

## 2021-07-30 DIAGNOSIS — Z113 Encounter for screening for infections with a predominantly sexual mode of transmission: Secondary | ICD-10-CM | POA: Diagnosis not present

## 2021-08-21 DIAGNOSIS — M9901 Segmental and somatic dysfunction of cervical region: Secondary | ICD-10-CM | POA: Diagnosis not present

## 2021-08-21 DIAGNOSIS — M50121 Cervical disc disorder at C4-C5 level with radiculopathy: Secondary | ICD-10-CM | POA: Diagnosis not present

## 2021-08-21 DIAGNOSIS — M542 Cervicalgia: Secondary | ICD-10-CM | POA: Diagnosis not present

## 2021-08-21 DIAGNOSIS — M9902 Segmental and somatic dysfunction of thoracic region: Secondary | ICD-10-CM | POA: Diagnosis not present

## 2021-08-21 DIAGNOSIS — M9903 Segmental and somatic dysfunction of lumbar region: Secondary | ICD-10-CM | POA: Diagnosis not present

## 2021-08-21 DIAGNOSIS — M546 Pain in thoracic spine: Secondary | ICD-10-CM | POA: Diagnosis not present

## 2021-08-21 DIAGNOSIS — M545 Low back pain, unspecified: Secondary | ICD-10-CM | POA: Diagnosis not present

## 2021-09-21 DIAGNOSIS — M542 Cervicalgia: Secondary | ICD-10-CM | POA: Diagnosis not present

## 2021-09-21 DIAGNOSIS — M546 Pain in thoracic spine: Secondary | ICD-10-CM | POA: Diagnosis not present

## 2021-09-21 DIAGNOSIS — M9901 Segmental and somatic dysfunction of cervical region: Secondary | ICD-10-CM | POA: Diagnosis not present

## 2021-09-21 DIAGNOSIS — M50121 Cervical disc disorder at C4-C5 level with radiculopathy: Secondary | ICD-10-CM | POA: Diagnosis not present

## 2021-09-21 DIAGNOSIS — M9902 Segmental and somatic dysfunction of thoracic region: Secondary | ICD-10-CM | POA: Diagnosis not present

## 2021-09-21 DIAGNOSIS — M9903 Segmental and somatic dysfunction of lumbar region: Secondary | ICD-10-CM | POA: Diagnosis not present

## 2021-09-21 DIAGNOSIS — M545 Low back pain, unspecified: Secondary | ICD-10-CM | POA: Diagnosis not present

## 2021-10-15 DIAGNOSIS — D649 Anemia, unspecified: Secondary | ICD-10-CM | POA: Diagnosis not present

## 2021-10-15 DIAGNOSIS — Z79899 Other long term (current) drug therapy: Secondary | ICD-10-CM | POA: Diagnosis not present

## 2021-10-15 DIAGNOSIS — F900 Attention-deficit hyperactivity disorder, predominantly inattentive type: Secondary | ICD-10-CM | POA: Diagnosis not present

## 2021-11-14 DIAGNOSIS — Z113 Encounter for screening for infections with a predominantly sexual mode of transmission: Secondary | ICD-10-CM | POA: Diagnosis not present

## 2022-01-21 DIAGNOSIS — F4320 Adjustment disorder, unspecified: Secondary | ICD-10-CM | POA: Diagnosis not present

## 2022-01-24 DIAGNOSIS — Z760 Encounter for issue of repeat prescription: Secondary | ICD-10-CM | POA: Diagnosis not present

## 2022-01-24 DIAGNOSIS — F900 Attention-deficit hyperactivity disorder, predominantly inattentive type: Secondary | ICD-10-CM | POA: Diagnosis not present

## 2022-01-28 DIAGNOSIS — F4320 Adjustment disorder, unspecified: Secondary | ICD-10-CM | POA: Diagnosis not present

## 2022-01-28 DIAGNOSIS — Z0000 Encounter for general adult medical examination without abnormal findings: Secondary | ICD-10-CM | POA: Diagnosis not present

## 2022-01-28 DIAGNOSIS — M9903 Segmental and somatic dysfunction of lumbar region: Secondary | ICD-10-CM | POA: Diagnosis not present

## 2022-01-28 DIAGNOSIS — M9901 Segmental and somatic dysfunction of cervical region: Secondary | ICD-10-CM | POA: Diagnosis not present

## 2022-01-28 DIAGNOSIS — M542 Cervicalgia: Secondary | ICD-10-CM | POA: Diagnosis not present

## 2022-01-28 DIAGNOSIS — Z111 Encounter for screening for respiratory tuberculosis: Secondary | ICD-10-CM | POA: Diagnosis not present

## 2022-01-28 DIAGNOSIS — M546 Pain in thoracic spine: Secondary | ICD-10-CM | POA: Diagnosis not present

## 2022-01-28 DIAGNOSIS — M50121 Cervical disc disorder at C4-C5 level with radiculopathy: Secondary | ICD-10-CM | POA: Diagnosis not present

## 2022-01-28 DIAGNOSIS — Z1331 Encounter for screening for depression: Secondary | ICD-10-CM | POA: Diagnosis not present

## 2022-01-28 DIAGNOSIS — M9902 Segmental and somatic dysfunction of thoracic region: Secondary | ICD-10-CM | POA: Diagnosis not present

## 2022-01-28 DIAGNOSIS — M545 Low back pain, unspecified: Secondary | ICD-10-CM | POA: Diagnosis not present

## 2022-01-28 DIAGNOSIS — Z1330 Encounter for screening examination for mental health and behavioral disorders, unspecified: Secondary | ICD-10-CM | POA: Diagnosis not present

## 2022-02-04 DIAGNOSIS — F4320 Adjustment disorder, unspecified: Secondary | ICD-10-CM | POA: Diagnosis not present

## 2022-02-05 DIAGNOSIS — Z111 Encounter for screening for respiratory tuberculosis: Secondary | ICD-10-CM | POA: Diagnosis not present

## 2022-02-11 DIAGNOSIS — F4320 Adjustment disorder, unspecified: Secondary | ICD-10-CM | POA: Diagnosis not present

## 2022-02-13 IMAGING — US US OB COMP LESS 14 WK
1 series · 14 of 28 positions shown · non-contrast
Comparison: None.

CLINICAL DATA: Vaginal bleeding

EXAM:
OBSTETRIC <14 WK ULTRASOUND
TECHNIQUE: Transabdominal ultrasound was performed for evaluation of the
gestation as well as the maternal uterus and adnexal regions.

[Series 1: ob us · 14 of 71 slices shown]
[im 3/71]
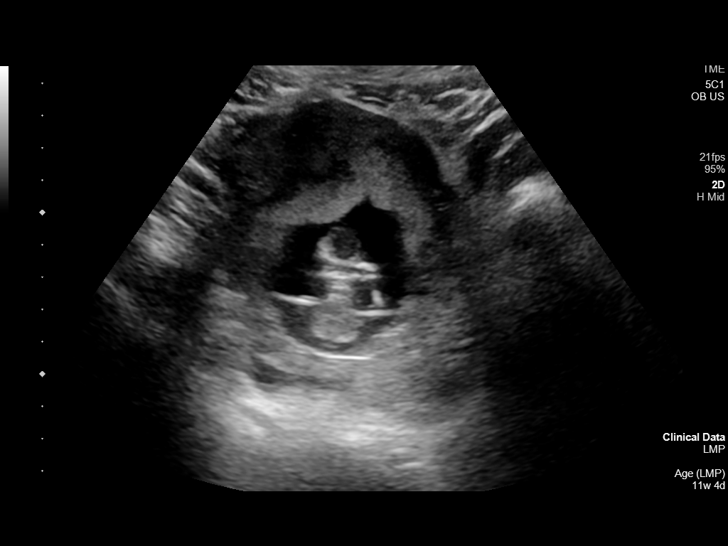
[im 8/71]
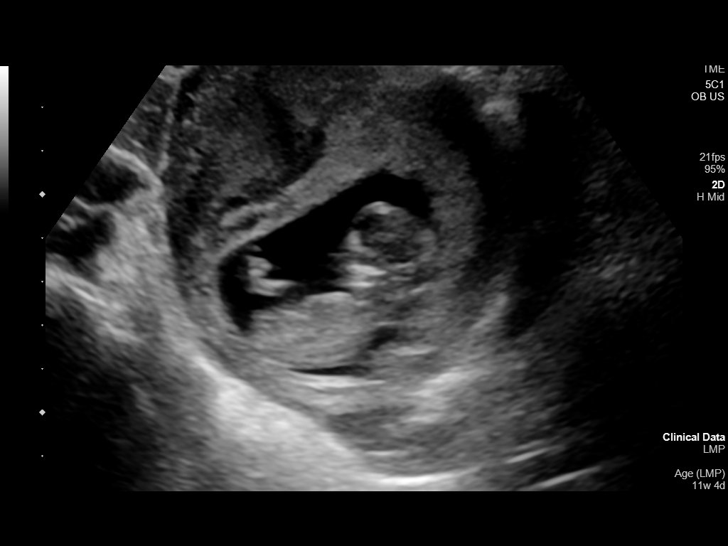
[im 13/71]
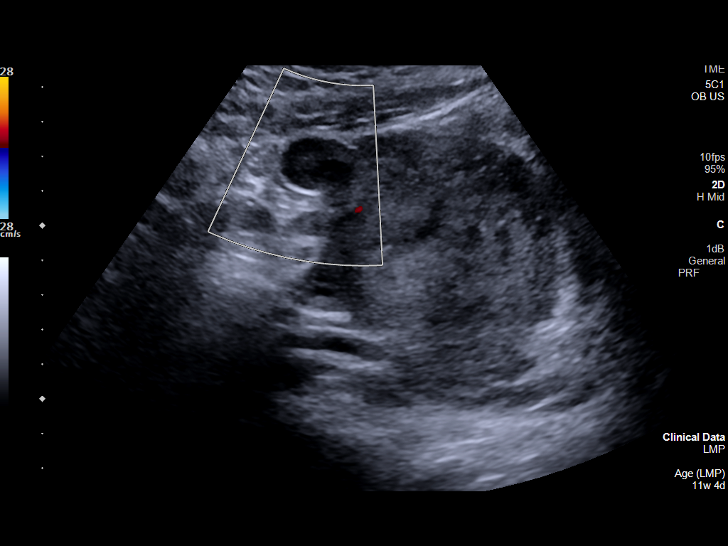
[im 19/71]
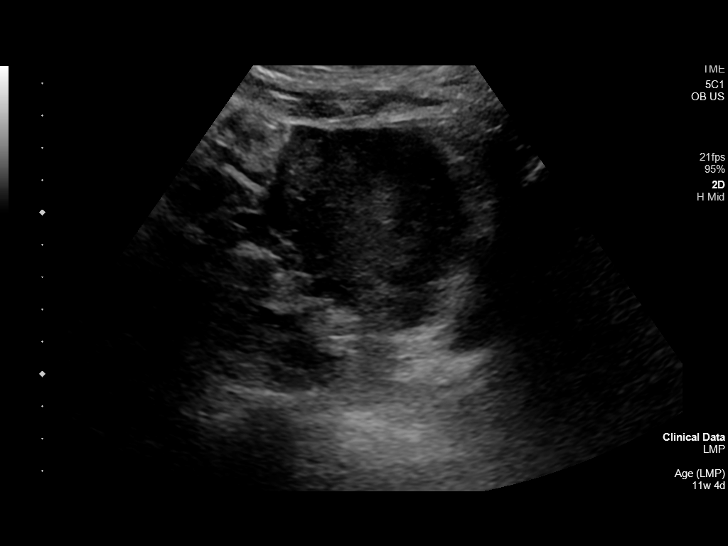
[im 24/71]
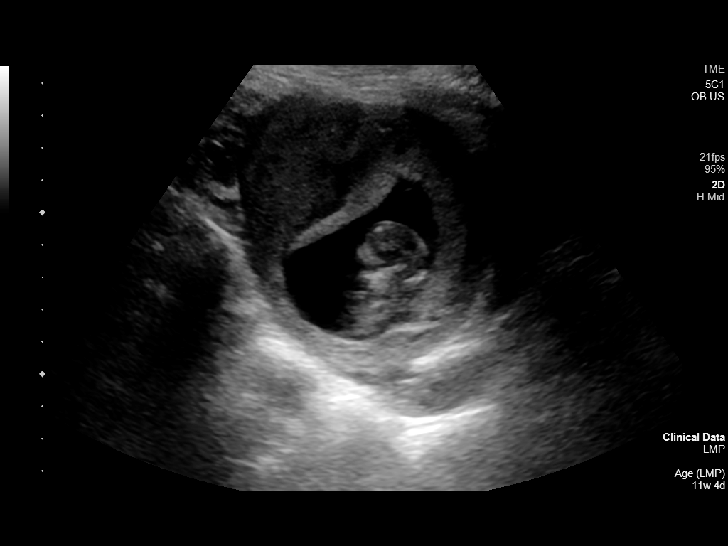
[im 29/71]
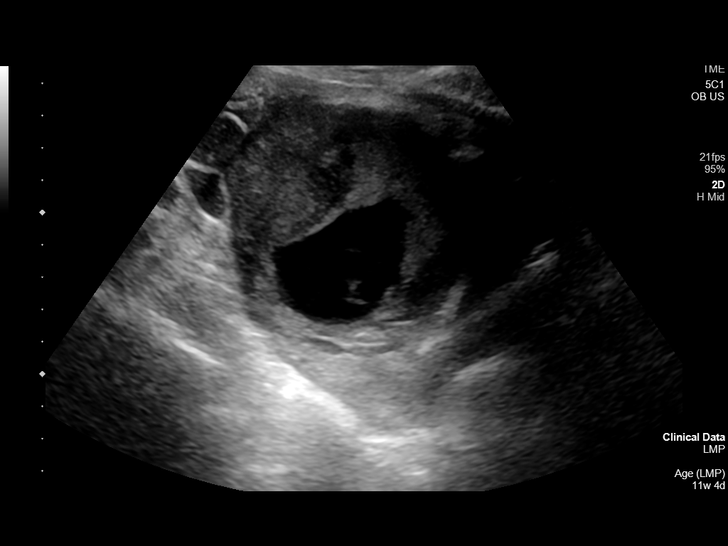
[im 34/71]
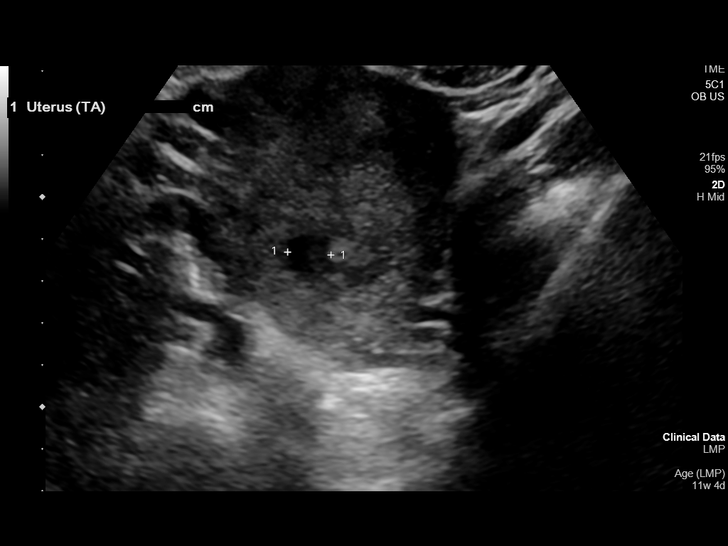
[im 39/71]
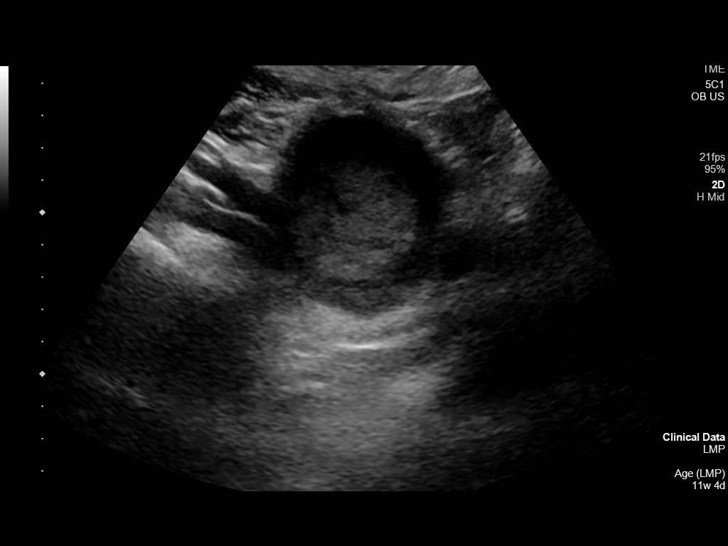
[im 45/71]
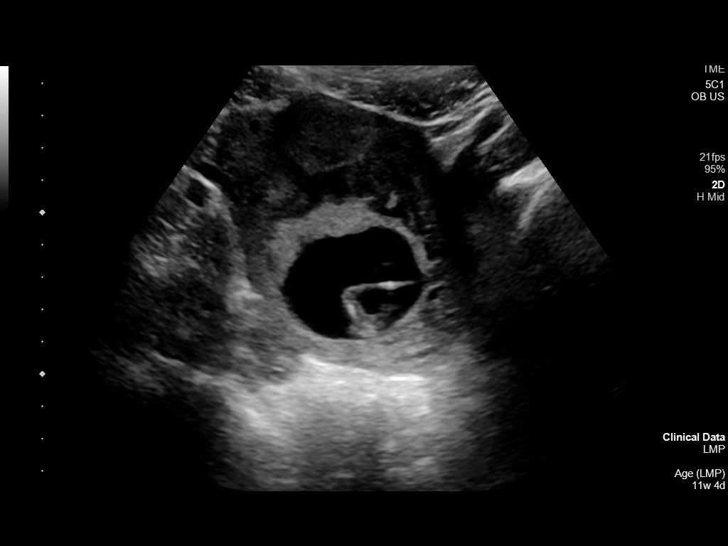
[im 50/71]
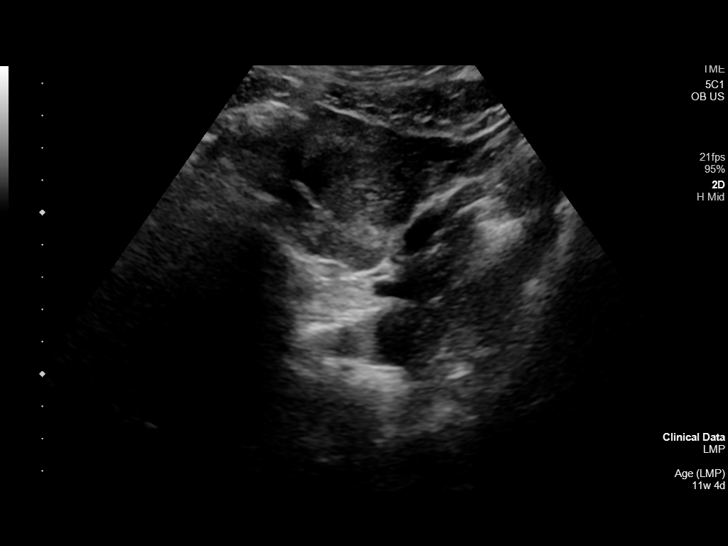
[im 55/71]
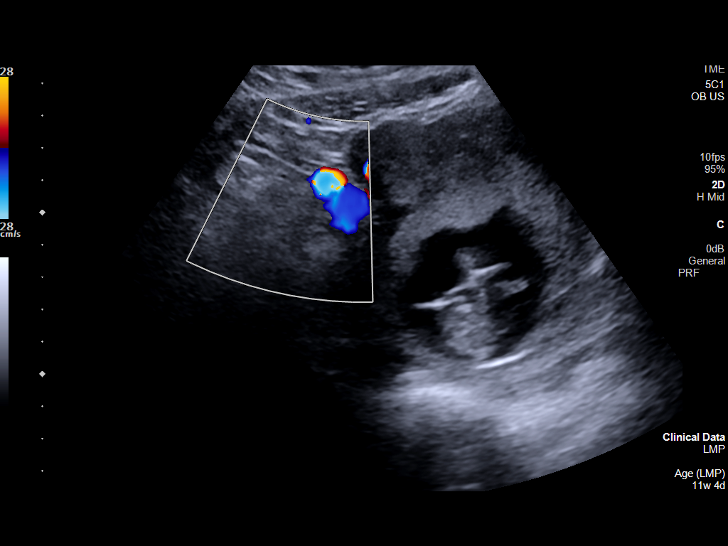
[im 60/71]
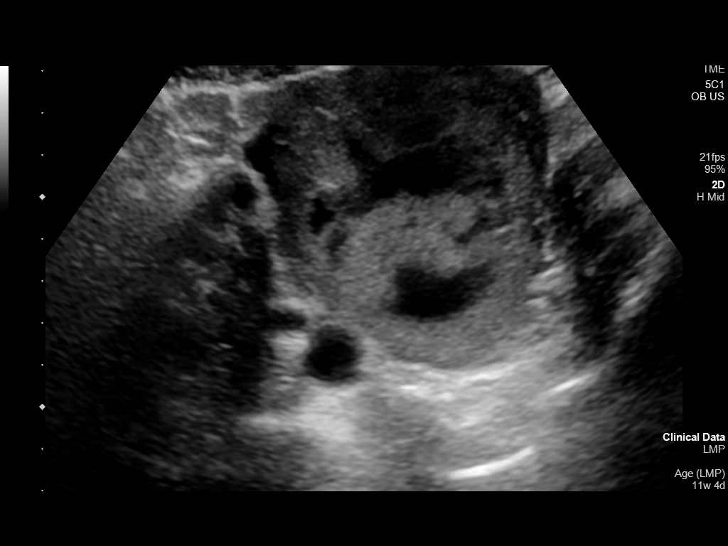
[im 65/71]
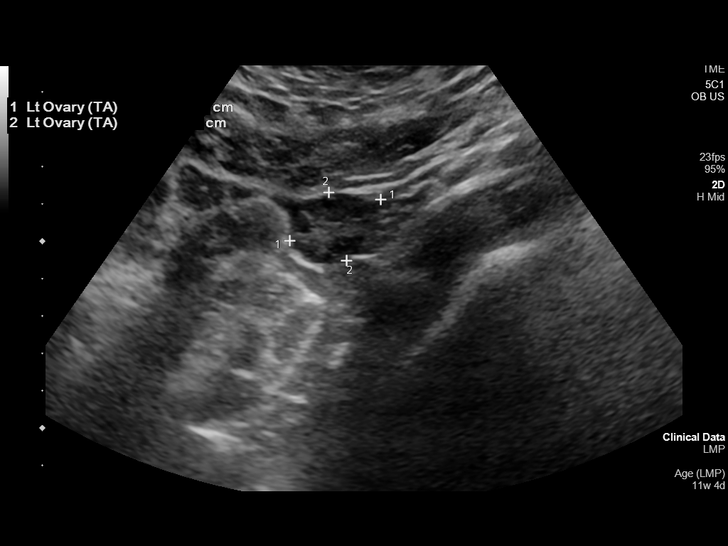
[im 71/71]
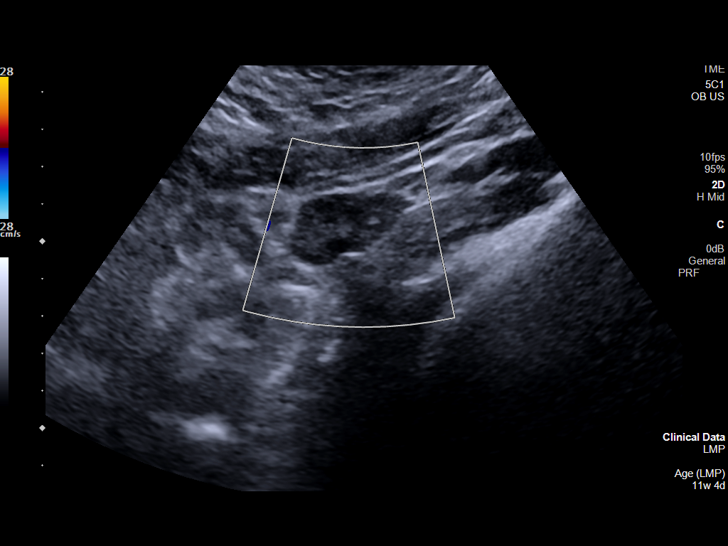

[14 of 28 positions shown; findings below may reference images not displayed]

FINDINGS: Intrauterine gestational sac: Single

Yolk sac:  Not visualized

Embryo:  Visualized

Cardiac Activity: Visualized

Heart Rate: 153 bpm

MSD:    mm    w     d

CRL:   47.4 mm   11 w 4 d                  US EDC: 01/05/2020

Subchorionic hemorrhage:  Small subchorionic hemorrhage

Maternal uterus/adnexae: Pedunculated 2.1 cm fundal fibroid. No
adnexal mass or free fluid.
IMPRESSION: Eleven week 4 day intrauterine pregnancy. Fetal heart rate 153 beats
per minute. Small subchorionic hemorrhage.

## 2022-02-18 DIAGNOSIS — F4320 Adjustment disorder, unspecified: Secondary | ICD-10-CM | POA: Diagnosis not present

## 2022-03-04 DIAGNOSIS — F4320 Adjustment disorder, unspecified: Secondary | ICD-10-CM | POA: Diagnosis not present

## 2022-03-06 DIAGNOSIS — M542 Cervicalgia: Secondary | ICD-10-CM | POA: Diagnosis not present

## 2022-03-06 DIAGNOSIS — M545 Low back pain, unspecified: Secondary | ICD-10-CM | POA: Diagnosis not present

## 2022-03-06 DIAGNOSIS — M50121 Cervical disc disorder at C4-C5 level with radiculopathy: Secondary | ICD-10-CM | POA: Diagnosis not present

## 2022-03-06 DIAGNOSIS — M9903 Segmental and somatic dysfunction of lumbar region: Secondary | ICD-10-CM | POA: Diagnosis not present

## 2022-03-06 DIAGNOSIS — M546 Pain in thoracic spine: Secondary | ICD-10-CM | POA: Diagnosis not present

## 2022-03-06 DIAGNOSIS — M9901 Segmental and somatic dysfunction of cervical region: Secondary | ICD-10-CM | POA: Diagnosis not present

## 2022-03-06 DIAGNOSIS — M9902 Segmental and somatic dysfunction of thoracic region: Secondary | ICD-10-CM | POA: Diagnosis not present

## 2022-03-12 DIAGNOSIS — F4320 Adjustment disorder, unspecified: Secondary | ICD-10-CM | POA: Diagnosis not present

## 2022-03-14 DIAGNOSIS — M50121 Cervical disc disorder at C4-C5 level with radiculopathy: Secondary | ICD-10-CM | POA: Diagnosis not present

## 2022-03-14 DIAGNOSIS — M9901 Segmental and somatic dysfunction of cervical region: Secondary | ICD-10-CM | POA: Diagnosis not present

## 2022-03-14 DIAGNOSIS — M542 Cervicalgia: Secondary | ICD-10-CM | POA: Diagnosis not present

## 2022-03-14 DIAGNOSIS — M545 Low back pain, unspecified: Secondary | ICD-10-CM | POA: Diagnosis not present

## 2022-03-14 DIAGNOSIS — M9902 Segmental and somatic dysfunction of thoracic region: Secondary | ICD-10-CM | POA: Diagnosis not present

## 2022-03-14 DIAGNOSIS — M9903 Segmental and somatic dysfunction of lumbar region: Secondary | ICD-10-CM | POA: Diagnosis not present

## 2022-03-14 DIAGNOSIS — M546 Pain in thoracic spine: Secondary | ICD-10-CM | POA: Diagnosis not present

## 2022-03-19 DIAGNOSIS — F4320 Adjustment disorder, unspecified: Secondary | ICD-10-CM | POA: Diagnosis not present

## 2022-03-28 DIAGNOSIS — F4320 Adjustment disorder, unspecified: Secondary | ICD-10-CM | POA: Diagnosis not present

## 2022-04-09 DIAGNOSIS — Z113 Encounter for screening for infections with a predominantly sexual mode of transmission: Secondary | ICD-10-CM | POA: Diagnosis not present

## 2022-04-09 DIAGNOSIS — Z7251 High risk heterosexual behavior: Secondary | ICD-10-CM | POA: Diagnosis not present

## 2022-04-11 DIAGNOSIS — F4320 Adjustment disorder, unspecified: Secondary | ICD-10-CM | POA: Diagnosis not present

## 2022-04-16 DIAGNOSIS — F4320 Adjustment disorder, unspecified: Secondary | ICD-10-CM | POA: Diagnosis not present

## 2022-04-30 DIAGNOSIS — F4320 Adjustment disorder, unspecified: Secondary | ICD-10-CM | POA: Diagnosis not present

## 2022-05-06 DIAGNOSIS — M545 Low back pain, unspecified: Secondary | ICD-10-CM | POA: Diagnosis not present

## 2022-05-06 DIAGNOSIS — M9902 Segmental and somatic dysfunction of thoracic region: Secondary | ICD-10-CM | POA: Diagnosis not present

## 2022-05-06 DIAGNOSIS — M9903 Segmental and somatic dysfunction of lumbar region: Secondary | ICD-10-CM | POA: Diagnosis not present

## 2022-05-06 DIAGNOSIS — M50121 Cervical disc disorder at C4-C5 level with radiculopathy: Secondary | ICD-10-CM | POA: Diagnosis not present

## 2022-05-06 DIAGNOSIS — M9901 Segmental and somatic dysfunction of cervical region: Secondary | ICD-10-CM | POA: Diagnosis not present

## 2022-05-06 DIAGNOSIS — M542 Cervicalgia: Secondary | ICD-10-CM | POA: Diagnosis not present

## 2022-05-06 DIAGNOSIS — M546 Pain in thoracic spine: Secondary | ICD-10-CM | POA: Diagnosis not present

## 2022-05-07 DIAGNOSIS — F4320 Adjustment disorder, unspecified: Secondary | ICD-10-CM | POA: Diagnosis not present

## 2022-05-14 DIAGNOSIS — F4320 Adjustment disorder, unspecified: Secondary | ICD-10-CM | POA: Diagnosis not present

## 2022-05-16 DIAGNOSIS — J4520 Mild intermittent asthma, uncomplicated: Secondary | ICD-10-CM | POA: Diagnosis not present

## 2022-05-16 DIAGNOSIS — F900 Attention-deficit hyperactivity disorder, predominantly inattentive type: Secondary | ICD-10-CM | POA: Diagnosis not present

## 2022-05-21 DIAGNOSIS — F4320 Adjustment disorder, unspecified: Secondary | ICD-10-CM | POA: Diagnosis not present

## 2022-05-27 DIAGNOSIS — F4320 Adjustment disorder, unspecified: Secondary | ICD-10-CM | POA: Diagnosis not present

## 2022-06-03 DIAGNOSIS — F4320 Adjustment disorder, unspecified: Secondary | ICD-10-CM | POA: Diagnosis not present

## 2022-06-12 DIAGNOSIS — R10819 Abdominal tenderness, unspecified site: Secondary | ICD-10-CM | POA: Diagnosis not present

## 2022-06-12 DIAGNOSIS — R112 Nausea with vomiting, unspecified: Secondary | ICD-10-CM | POA: Diagnosis not present

## 2022-06-12 DIAGNOSIS — R197 Diarrhea, unspecified: Secondary | ICD-10-CM | POA: Diagnosis not present

## 2022-06-12 DIAGNOSIS — R1010 Upper abdominal pain, unspecified: Secondary | ICD-10-CM | POA: Diagnosis not present

## 2022-06-24 DIAGNOSIS — F4320 Adjustment disorder, unspecified: Secondary | ICD-10-CM | POA: Diagnosis not present

## 2022-07-01 DIAGNOSIS — M545 Low back pain, unspecified: Secondary | ICD-10-CM | POA: Diagnosis not present

## 2022-07-01 DIAGNOSIS — M546 Pain in thoracic spine: Secondary | ICD-10-CM | POA: Diagnosis not present

## 2022-07-01 DIAGNOSIS — M9903 Segmental and somatic dysfunction of lumbar region: Secondary | ICD-10-CM | POA: Diagnosis not present

## 2022-07-01 DIAGNOSIS — M9901 Segmental and somatic dysfunction of cervical region: Secondary | ICD-10-CM | POA: Diagnosis not present

## 2022-07-01 DIAGNOSIS — M9902 Segmental and somatic dysfunction of thoracic region: Secondary | ICD-10-CM | POA: Diagnosis not present

## 2022-07-01 DIAGNOSIS — M542 Cervicalgia: Secondary | ICD-10-CM | POA: Diagnosis not present

## 2022-07-01 DIAGNOSIS — M50121 Cervical disc disorder at C4-C5 level with radiculopathy: Secondary | ICD-10-CM | POA: Diagnosis not present

## 2022-07-02 DIAGNOSIS — F4320 Adjustment disorder, unspecified: Secondary | ICD-10-CM | POA: Diagnosis not present

## 2022-07-09 DIAGNOSIS — F4320 Adjustment disorder, unspecified: Secondary | ICD-10-CM | POA: Diagnosis not present

## 2022-07-15 DIAGNOSIS — F4320 Adjustment disorder, unspecified: Secondary | ICD-10-CM | POA: Diagnosis not present

## 2022-07-22 DIAGNOSIS — F4320 Adjustment disorder, unspecified: Secondary | ICD-10-CM | POA: Diagnosis not present

## 2022-07-29 DIAGNOSIS — M546 Pain in thoracic spine: Secondary | ICD-10-CM | POA: Diagnosis not present

## 2022-07-29 DIAGNOSIS — F902 Attention-deficit hyperactivity disorder, combined type: Secondary | ICD-10-CM | POA: Diagnosis not present

## 2022-07-29 DIAGNOSIS — M50121 Cervical disc disorder at C4-C5 level with radiculopathy: Secondary | ICD-10-CM | POA: Diagnosis not present

## 2022-07-29 DIAGNOSIS — M9901 Segmental and somatic dysfunction of cervical region: Secondary | ICD-10-CM | POA: Diagnosis not present

## 2022-07-29 DIAGNOSIS — F419 Anxiety disorder, unspecified: Secondary | ICD-10-CM | POA: Diagnosis not present

## 2022-07-29 DIAGNOSIS — M9903 Segmental and somatic dysfunction of lumbar region: Secondary | ICD-10-CM | POA: Diagnosis not present

## 2022-07-29 DIAGNOSIS — M545 Low back pain, unspecified: Secondary | ICD-10-CM | POA: Diagnosis not present

## 2022-07-29 DIAGNOSIS — F4320 Adjustment disorder, unspecified: Secondary | ICD-10-CM | POA: Diagnosis not present

## 2022-07-29 DIAGNOSIS — M542 Cervicalgia: Secondary | ICD-10-CM | POA: Diagnosis not present

## 2022-07-29 DIAGNOSIS — M9902 Segmental and somatic dysfunction of thoracic region: Secondary | ICD-10-CM | POA: Diagnosis not present

## 2022-08-05 DIAGNOSIS — F4320 Adjustment disorder, unspecified: Secondary | ICD-10-CM | POA: Diagnosis not present

## 2022-08-13 DIAGNOSIS — F4320 Adjustment disorder, unspecified: Secondary | ICD-10-CM | POA: Diagnosis not present

## 2022-08-14 DIAGNOSIS — F902 Attention-deficit hyperactivity disorder, combined type: Secondary | ICD-10-CM | POA: Diagnosis not present

## 2022-08-14 DIAGNOSIS — F419 Anxiety disorder, unspecified: Secondary | ICD-10-CM | POA: Diagnosis not present

## 2022-08-15 DIAGNOSIS — Z113 Encounter for screening for infections with a predominantly sexual mode of transmission: Secondary | ICD-10-CM | POA: Diagnosis not present

## 2022-08-20 DIAGNOSIS — F4320 Adjustment disorder, unspecified: Secondary | ICD-10-CM | POA: Diagnosis not present

## 2022-08-29 DIAGNOSIS — F4320 Adjustment disorder, unspecified: Secondary | ICD-10-CM | POA: Diagnosis not present

## 2022-09-03 DIAGNOSIS — F4320 Adjustment disorder, unspecified: Secondary | ICD-10-CM | POA: Diagnosis not present

## 2022-09-09 DIAGNOSIS — F4320 Adjustment disorder, unspecified: Secondary | ICD-10-CM | POA: Diagnosis not present

## 2022-09-11 DIAGNOSIS — F902 Attention-deficit hyperactivity disorder, combined type: Secondary | ICD-10-CM | POA: Diagnosis not present

## 2022-09-24 DIAGNOSIS — F4320 Adjustment disorder, unspecified: Secondary | ICD-10-CM | POA: Diagnosis not present

## 2022-09-30 DIAGNOSIS — N946 Dysmenorrhea, unspecified: Secondary | ICD-10-CM | POA: Diagnosis not present

## 2022-09-30 DIAGNOSIS — Z309 Encounter for contraceptive management, unspecified: Secondary | ICD-10-CM | POA: Diagnosis not present

## 2022-09-30 DIAGNOSIS — F4320 Adjustment disorder, unspecified: Secondary | ICD-10-CM | POA: Diagnosis not present

## 2022-10-07 DIAGNOSIS — F4320 Adjustment disorder, unspecified: Secondary | ICD-10-CM | POA: Diagnosis not present

## 2022-10-09 DIAGNOSIS — F902 Attention-deficit hyperactivity disorder, combined type: Secondary | ICD-10-CM | POA: Diagnosis not present

## 2022-10-14 DIAGNOSIS — F4320 Adjustment disorder, unspecified: Secondary | ICD-10-CM | POA: Diagnosis not present

## 2022-10-21 DIAGNOSIS — F4320 Adjustment disorder, unspecified: Secondary | ICD-10-CM | POA: Diagnosis not present

## 2022-11-12 DIAGNOSIS — F900 Attention-deficit hyperactivity disorder, predominantly inattentive type: Secondary | ICD-10-CM | POA: Diagnosis not present

## 2022-11-18 DIAGNOSIS — F4320 Adjustment disorder, unspecified: Secondary | ICD-10-CM | POA: Diagnosis not present

## 2022-11-18 DIAGNOSIS — L301 Dyshidrosis [pompholyx]: Secondary | ICD-10-CM | POA: Diagnosis not present

## 2022-11-20 DIAGNOSIS — F902 Attention-deficit hyperactivity disorder, combined type: Secondary | ICD-10-CM | POA: Diagnosis not present

## 2022-12-24 DIAGNOSIS — F4320 Adjustment disorder, unspecified: Secondary | ICD-10-CM | POA: Diagnosis not present

## 2023-01-29 DIAGNOSIS — M9902 Segmental and somatic dysfunction of thoracic region: Secondary | ICD-10-CM | POA: Diagnosis not present

## 2023-01-29 DIAGNOSIS — M546 Pain in thoracic spine: Secondary | ICD-10-CM | POA: Diagnosis not present

## 2023-01-29 DIAGNOSIS — M9903 Segmental and somatic dysfunction of lumbar region: Secondary | ICD-10-CM | POA: Diagnosis not present

## 2023-01-29 DIAGNOSIS — M542 Cervicalgia: Secondary | ICD-10-CM | POA: Diagnosis not present

## 2023-01-29 DIAGNOSIS — M9901 Segmental and somatic dysfunction of cervical region: Secondary | ICD-10-CM | POA: Diagnosis not present

## 2023-01-29 DIAGNOSIS — M545 Low back pain, unspecified: Secondary | ICD-10-CM | POA: Diagnosis not present

## 2023-01-30 DIAGNOSIS — L301 Dyshidrosis [pompholyx]: Secondary | ICD-10-CM | POA: Diagnosis not present

## 2023-01-30 DIAGNOSIS — Z13220 Encounter for screening for lipoid disorders: Secondary | ICD-10-CM | POA: Diagnosis not present

## 2023-01-30 DIAGNOSIS — Z1331 Encounter for screening for depression: Secondary | ICD-10-CM | POA: Diagnosis not present

## 2023-01-30 DIAGNOSIS — Z0000 Encounter for general adult medical examination without abnormal findings: Secondary | ICD-10-CM | POA: Diagnosis not present

## 2023-01-30 DIAGNOSIS — L2084 Intrinsic (allergic) eczema: Secondary | ICD-10-CM | POA: Diagnosis not present

## 2023-01-30 DIAGNOSIS — Z0289 Encounter for other administrative examinations: Secondary | ICD-10-CM | POA: Diagnosis not present

## 2023-02-19 DIAGNOSIS — M9902 Segmental and somatic dysfunction of thoracic region: Secondary | ICD-10-CM | POA: Diagnosis not present

## 2023-02-19 DIAGNOSIS — M545 Low back pain, unspecified: Secondary | ICD-10-CM | POA: Diagnosis not present

## 2023-02-19 DIAGNOSIS — M542 Cervicalgia: Secondary | ICD-10-CM | POA: Diagnosis not present

## 2023-02-19 DIAGNOSIS — M9901 Segmental and somatic dysfunction of cervical region: Secondary | ICD-10-CM | POA: Diagnosis not present

## 2023-02-19 DIAGNOSIS — M546 Pain in thoracic spine: Secondary | ICD-10-CM | POA: Diagnosis not present

## 2023-02-19 DIAGNOSIS — M9903 Segmental and somatic dysfunction of lumbar region: Secondary | ICD-10-CM | POA: Diagnosis not present

## 2023-02-20 DIAGNOSIS — F4320 Adjustment disorder, unspecified: Secondary | ICD-10-CM | POA: Diagnosis not present

## 2023-03-04 DIAGNOSIS — F4320 Adjustment disorder, unspecified: Secondary | ICD-10-CM | POA: Diagnosis not present

## 2023-03-10 DIAGNOSIS — F4320 Adjustment disorder, unspecified: Secondary | ICD-10-CM | POA: Diagnosis not present

## 2023-04-04 DIAGNOSIS — F4320 Adjustment disorder, unspecified: Secondary | ICD-10-CM | POA: Diagnosis not present

## 2023-04-14 DIAGNOSIS — N946 Dysmenorrhea, unspecified: Secondary | ICD-10-CM | POA: Diagnosis not present

## 2023-04-14 DIAGNOSIS — Z87891 Personal history of nicotine dependence: Secondary | ICD-10-CM | POA: Diagnosis not present

## 2023-04-14 DIAGNOSIS — R102 Pelvic and perineal pain: Secondary | ICD-10-CM | POA: Diagnosis not present

## 2023-04-18 DIAGNOSIS — N946 Dysmenorrhea, unspecified: Secondary | ICD-10-CM | POA: Diagnosis not present

## 2023-05-02 DIAGNOSIS — F4320 Adjustment disorder, unspecified: Secondary | ICD-10-CM | POA: Diagnosis not present

## 2023-05-08 DIAGNOSIS — M546 Pain in thoracic spine: Secondary | ICD-10-CM | POA: Diagnosis not present

## 2023-05-08 DIAGNOSIS — M9903 Segmental and somatic dysfunction of lumbar region: Secondary | ICD-10-CM | POA: Diagnosis not present

## 2023-05-08 DIAGNOSIS — M545 Low back pain, unspecified: Secondary | ICD-10-CM | POA: Diagnosis not present

## 2023-05-08 DIAGNOSIS — M542 Cervicalgia: Secondary | ICD-10-CM | POA: Diagnosis not present

## 2023-05-08 DIAGNOSIS — M9901 Segmental and somatic dysfunction of cervical region: Secondary | ICD-10-CM | POA: Diagnosis not present

## 2023-05-08 DIAGNOSIS — M9902 Segmental and somatic dysfunction of thoracic region: Secondary | ICD-10-CM | POA: Diagnosis not present

## 2023-06-11 DIAGNOSIS — L0591 Pilonidal cyst without abscess: Secondary | ICD-10-CM | POA: Diagnosis not present

## 2023-06-13 DIAGNOSIS — L0591 Pilonidal cyst without abscess: Secondary | ICD-10-CM | POA: Diagnosis not present

## 2023-06-18 DIAGNOSIS — F902 Attention-deficit hyperactivity disorder, combined type: Secondary | ICD-10-CM | POA: Diagnosis not present

## 2023-06-25 DIAGNOSIS — Z13220 Encounter for screening for lipoid disorders: Secondary | ICD-10-CM | POA: Diagnosis not present

## 2023-06-26 DIAGNOSIS — L2089 Other atopic dermatitis: Secondary | ICD-10-CM | POA: Diagnosis not present

## 2023-07-11 DIAGNOSIS — Z630 Problems in relationship with spouse or partner: Secondary | ICD-10-CM | POA: Diagnosis not present

## 2023-07-11 DIAGNOSIS — Z8249 Family history of ischemic heart disease and other diseases of the circulatory system: Secondary | ICD-10-CM | POA: Diagnosis not present

## 2023-07-11 DIAGNOSIS — Z91041 Radiographic dye allergy status: Secondary | ICD-10-CM | POA: Diagnosis not present

## 2023-07-11 DIAGNOSIS — R0789 Other chest pain: Secondary | ICD-10-CM | POA: Diagnosis not present

## 2023-08-04 DIAGNOSIS — N946 Dysmenorrhea, unspecified: Secondary | ICD-10-CM | POA: Diagnosis not present

## 2023-08-05 DIAGNOSIS — F900 Attention-deficit hyperactivity disorder, predominantly inattentive type: Secondary | ICD-10-CM | POA: Diagnosis not present

## 2023-08-06 DIAGNOSIS — M542 Cervicalgia: Secondary | ICD-10-CM | POA: Diagnosis not present

## 2023-08-06 DIAGNOSIS — M545 Low back pain, unspecified: Secondary | ICD-10-CM | POA: Diagnosis not present

## 2023-08-06 DIAGNOSIS — M546 Pain in thoracic spine: Secondary | ICD-10-CM | POA: Diagnosis not present

## 2023-08-06 DIAGNOSIS — M9902 Segmental and somatic dysfunction of thoracic region: Secondary | ICD-10-CM | POA: Diagnosis not present

## 2023-08-06 DIAGNOSIS — F902 Attention-deficit hyperactivity disorder, combined type: Secondary | ICD-10-CM | POA: Diagnosis not present

## 2023-08-06 DIAGNOSIS — M9903 Segmental and somatic dysfunction of lumbar region: Secondary | ICD-10-CM | POA: Diagnosis not present

## 2023-08-06 DIAGNOSIS — M9901 Segmental and somatic dysfunction of cervical region: Secondary | ICD-10-CM | POA: Diagnosis not present

## 2023-09-15 DIAGNOSIS — M9901 Segmental and somatic dysfunction of cervical region: Secondary | ICD-10-CM | POA: Diagnosis not present

## 2023-09-15 DIAGNOSIS — M545 Low back pain, unspecified: Secondary | ICD-10-CM | POA: Diagnosis not present

## 2023-09-15 DIAGNOSIS — M9903 Segmental and somatic dysfunction of lumbar region: Secondary | ICD-10-CM | POA: Diagnosis not present

## 2023-09-15 DIAGNOSIS — M9902 Segmental and somatic dysfunction of thoracic region: Secondary | ICD-10-CM | POA: Diagnosis not present

## 2023-09-15 DIAGNOSIS — M542 Cervicalgia: Secondary | ICD-10-CM | POA: Diagnosis not present

## 2023-09-15 DIAGNOSIS — M546 Pain in thoracic spine: Secondary | ICD-10-CM | POA: Diagnosis not present

## 2023-09-19 DIAGNOSIS — L2089 Other atopic dermatitis: Secondary | ICD-10-CM | POA: Diagnosis not present

## 2023-10-01 DIAGNOSIS — Z91041 Radiographic dye allergy status: Secondary | ICD-10-CM | POA: Diagnosis not present

## 2023-10-01 DIAGNOSIS — J020 Streptococcal pharyngitis: Secondary | ICD-10-CM | POA: Diagnosis not present

## 2023-10-01 DIAGNOSIS — Z1152 Encounter for screening for COVID-19: Secondary | ICD-10-CM | POA: Diagnosis not present

## 2023-10-01 DIAGNOSIS — Z87891 Personal history of nicotine dependence: Secondary | ICD-10-CM | POA: Diagnosis not present

## 2023-10-08 DIAGNOSIS — M546 Pain in thoracic spine: Secondary | ICD-10-CM | POA: Diagnosis not present

## 2023-10-08 DIAGNOSIS — M9901 Segmental and somatic dysfunction of cervical region: Secondary | ICD-10-CM | POA: Diagnosis not present

## 2023-10-08 DIAGNOSIS — M9903 Segmental and somatic dysfunction of lumbar region: Secondary | ICD-10-CM | POA: Diagnosis not present

## 2023-10-08 DIAGNOSIS — M545 Low back pain, unspecified: Secondary | ICD-10-CM | POA: Diagnosis not present

## 2023-10-08 DIAGNOSIS — M9902 Segmental and somatic dysfunction of thoracic region: Secondary | ICD-10-CM | POA: Diagnosis not present

## 2023-10-08 DIAGNOSIS — M542 Cervicalgia: Secondary | ICD-10-CM | POA: Diagnosis not present

## 2023-10-08 DIAGNOSIS — F411 Generalized anxiety disorder: Secondary | ICD-10-CM | POA: Diagnosis not present

## 2023-10-15 DIAGNOSIS — F4323 Adjustment disorder with mixed anxiety and depressed mood: Secondary | ICD-10-CM | POA: Diagnosis not present

## 2023-10-15 DIAGNOSIS — F902 Attention-deficit hyperactivity disorder, combined type: Secondary | ICD-10-CM | POA: Diagnosis not present

## 2023-11-11 DIAGNOSIS — M545 Low back pain, unspecified: Secondary | ICD-10-CM | POA: Diagnosis not present

## 2023-11-11 DIAGNOSIS — M9903 Segmental and somatic dysfunction of lumbar region: Secondary | ICD-10-CM | POA: Diagnosis not present

## 2023-11-11 DIAGNOSIS — M542 Cervicalgia: Secondary | ICD-10-CM | POA: Diagnosis not present

## 2023-11-11 DIAGNOSIS — M9901 Segmental and somatic dysfunction of cervical region: Secondary | ICD-10-CM | POA: Diagnosis not present

## 2023-11-11 DIAGNOSIS — M9902 Segmental and somatic dysfunction of thoracic region: Secondary | ICD-10-CM | POA: Diagnosis not present

## 2023-11-11 DIAGNOSIS — M546 Pain in thoracic spine: Secondary | ICD-10-CM | POA: Diagnosis not present

## 2023-11-19 DIAGNOSIS — F902 Attention-deficit hyperactivity disorder, combined type: Secondary | ICD-10-CM | POA: Diagnosis not present

## 2023-12-02 DIAGNOSIS — F902 Attention-deficit hyperactivity disorder, combined type: Secondary | ICD-10-CM | POA: Diagnosis not present

## 2024-01-15 DIAGNOSIS — F902 Attention-deficit hyperactivity disorder, combined type: Secondary | ICD-10-CM | POA: Diagnosis not present

## 2024-01-19 DIAGNOSIS — L2089 Other atopic dermatitis: Secondary | ICD-10-CM | POA: Diagnosis not present

## 2024-01-28 DIAGNOSIS — R079 Chest pain, unspecified: Secondary | ICD-10-CM | POA: Diagnosis not present

## 2024-01-28 DIAGNOSIS — R5382 Chronic fatigue, unspecified: Secondary | ICD-10-CM | POA: Diagnosis not present

## 2024-01-28 DIAGNOSIS — Z1331 Encounter for screening for depression: Secondary | ICD-10-CM | POA: Diagnosis not present

## 2024-01-28 DIAGNOSIS — Z1330 Encounter for screening examination for mental health and behavioral disorders, unspecified: Secondary | ICD-10-CM | POA: Diagnosis not present

## 2024-01-28 DIAGNOSIS — J4520 Mild intermittent asthma, uncomplicated: Secondary | ICD-10-CM | POA: Diagnosis not present

## 2024-01-28 DIAGNOSIS — D509 Iron deficiency anemia, unspecified: Secondary | ICD-10-CM | POA: Diagnosis not present

## 2024-01-30 DIAGNOSIS — R5382 Chronic fatigue, unspecified: Secondary | ICD-10-CM | POA: Diagnosis not present

## 2024-01-30 DIAGNOSIS — D509 Iron deficiency anemia, unspecified: Secondary | ICD-10-CM | POA: Diagnosis not present

## 2024-02-03 DIAGNOSIS — R000 Tachycardia, unspecified: Secondary | ICD-10-CM | POA: Diagnosis not present

## 2024-02-03 DIAGNOSIS — D509 Iron deficiency anemia, unspecified: Secondary | ICD-10-CM | POA: Diagnosis not present

## 2024-02-03 DIAGNOSIS — E162 Hypoglycemia, unspecified: Secondary | ICD-10-CM | POA: Diagnosis not present

## 2024-02-03 DIAGNOSIS — Z87891 Personal history of nicotine dependence: Secondary | ICD-10-CM | POA: Diagnosis not present

## 2024-02-03 DIAGNOSIS — R9431 Abnormal electrocardiogram [ECG] [EKG]: Secondary | ICD-10-CM | POA: Diagnosis not present

## 2024-02-19 DIAGNOSIS — M542 Cervicalgia: Secondary | ICD-10-CM | POA: Diagnosis not present

## 2024-02-19 DIAGNOSIS — M9901 Segmental and somatic dysfunction of cervical region: Secondary | ICD-10-CM | POA: Diagnosis not present

## 2024-02-19 DIAGNOSIS — M546 Pain in thoracic spine: Secondary | ICD-10-CM | POA: Diagnosis not present

## 2024-02-19 DIAGNOSIS — M9902 Segmental and somatic dysfunction of thoracic region: Secondary | ICD-10-CM | POA: Diagnosis not present

## 2024-02-19 DIAGNOSIS — M545 Low back pain, unspecified: Secondary | ICD-10-CM | POA: Diagnosis not present

## 2024-02-19 DIAGNOSIS — M9903 Segmental and somatic dysfunction of lumbar region: Secondary | ICD-10-CM | POA: Diagnosis not present

## 2024-02-22 DIAGNOSIS — D509 Iron deficiency anemia, unspecified: Secondary | ICD-10-CM | POA: Diagnosis not present

## 2024-03-16 DIAGNOSIS — F411 Generalized anxiety disorder: Secondary | ICD-10-CM | POA: Diagnosis not present
